# Patient Record
Sex: Male | Born: 1945 | Race: White | Hispanic: No | Marital: Married | Smoking: Never smoker
Health system: Southern US, Community
[De-identification: ages and names within clinical notes are randomized; demographics above are authoritative.]

## PROBLEM LIST (undated history)

## (undated) DIAGNOSIS — G40909 Epilepsy, unspecified, not intractable, without status epilepticus: Secondary | ICD-10-CM

## (undated) DIAGNOSIS — I2510 Atherosclerotic heart disease of native coronary artery without angina pectoris: Secondary | ICD-10-CM

## (undated) DIAGNOSIS — E785 Hyperlipidemia, unspecified: Secondary | ICD-10-CM

## (undated) DIAGNOSIS — E119 Type 2 diabetes mellitus without complications: Secondary | ICD-10-CM

## (undated) DIAGNOSIS — I2109 ST elevation (STEMI) myocardial infarction involving other coronary artery of anterior wall: Secondary | ICD-10-CM

## (undated) DIAGNOSIS — I639 Cerebral infarction, unspecified: Secondary | ICD-10-CM

## (undated) DIAGNOSIS — H409 Unspecified glaucoma: Secondary | ICD-10-CM

## (undated) DIAGNOSIS — Z951 Presence of aortocoronary bypass graft: Secondary | ICD-10-CM

## (undated) HISTORY — DX: Epilepsy, unspecified, not intractable, without status epilepticus: G40.909

## (undated) HISTORY — DX: Unspecified glaucoma: H40.9

## (undated) HISTORY — DX: Atherosclerotic heart disease of native coronary artery without angina pectoris: I25.10

## (undated) HISTORY — DX: Cerebral infarction, unspecified: I63.9

## (undated) HISTORY — DX: Presence of aortocoronary bypass graft: Z95.1

## (undated) HISTORY — DX: Hyperlipidemia, unspecified: E78.5

## (undated) HISTORY — DX: Type 2 diabetes mellitus without complications: E11.9

## (undated) HISTORY — DX: ST elevation (STEMI) myocardial infarction involving other coronary artery of anterior wall: I21.09

## (undated) HISTORY — DX: Neonatal cerebral infarction, unspecified side: P91.829

---

## 1995-01-12 DIAGNOSIS — I2109 ST elevation (STEMI) myocardial infarction involving other coronary artery of anterior wall: Secondary | ICD-10-CM

## 1995-01-12 DIAGNOSIS — Z951 Presence of aortocoronary bypass graft: Secondary | ICD-10-CM

## 1995-01-12 HISTORY — DX: Presence of aortocoronary bypass graft: Z95.1

## 1995-01-12 HISTORY — DX: ST elevation (STEMI) myocardial infarction involving other coronary artery of anterior wall: I21.09

## 1995-01-12 HISTORY — PX: CORONARY ARTERY BYPASS GRAFT: SHX141

## 1995-05-05 DIAGNOSIS — I2109 ST elevation (STEMI) myocardial infarction involving other coronary artery of anterior wall: Secondary | ICD-10-CM | POA: Insufficient documentation

## 1995-11-27 HISTORY — PX: CARDIAC CATHETERIZATION: SHX172

## 1999-10-13 ENCOUNTER — Encounter: Admission: RE | Admit: 1999-10-13 | Discharge: 2000-01-11 | Payer: Self-pay | Admitting: Cardiology

## 2000-01-12 DIAGNOSIS — I2510 Atherosclerotic heart disease of native coronary artery without angina pectoris: Secondary | ICD-10-CM

## 2000-01-12 HISTORY — DX: Atherosclerotic heart disease of native coronary artery without angina pectoris: I25.10

## 2000-03-02 ENCOUNTER — Ambulatory Visit (HOSPITAL_COMMUNITY): Admission: RE | Admit: 2000-03-02 | Discharge: 2000-03-02 | Payer: Self-pay | Admitting: Cardiology

## 2000-03-02 ENCOUNTER — Encounter: Payer: Self-pay | Admitting: Cardiology

## 2000-03-02 HISTORY — PX: CARDIAC CATHETERIZATION: SHX172

## 2000-03-31 DIAGNOSIS — I2510 Atherosclerotic heart disease of native coronary artery without angina pectoris: Secondary | ICD-10-CM | POA: Insufficient documentation

## 2003-09-06 ENCOUNTER — Encounter: Admission: RE | Admit: 2003-09-06 | Discharge: 2003-09-06 | Payer: Self-pay | Admitting: Internal Medicine

## 2008-01-12 HISTORY — PX: NM MYOVIEW LTD: HXRAD82

## 2008-06-28 ENCOUNTER — Encounter: Admission: RE | Admit: 2008-06-28 | Discharge: 2008-06-28 | Payer: Self-pay | Admitting: *Deleted

## 2008-07-03 ENCOUNTER — Ambulatory Visit (HOSPITAL_BASED_OUTPATIENT_CLINIC_OR_DEPARTMENT_OTHER): Admission: RE | Admit: 2008-07-03 | Discharge: 2008-07-03 | Payer: Self-pay | Admitting: Urology

## 2010-04-20 LAB — POCT HEMOGLOBIN-HEMACUE: Hemoglobin: 13.6 g/dL (ref 13.0–17.0)

## 2010-04-20 LAB — GLUCOSE, CAPILLARY
Glucose-Capillary: 144 mg/dL — ABNORMAL HIGH (ref 70–99)
Glucose-Capillary: 159 mg/dL — ABNORMAL HIGH (ref 70–99)

## 2010-05-26 NOTE — Op Note (Signed)
NAMEDAJION, BICKFORD            ACCOUNT NO.:  [id number]   MEDICAL RECORD NO.:  [id number]          PATIENT TYPE:  AMB   LOCATION:  [facility]                         FACILITY:  [facility]   PHYSICIAN:  Excell Seltzer. Annabell Howells, M.D.    DATE OF BIRTH:  February 12, 1945   DATE OF PROCEDURE:  07/03/2008  DATE OF DISCHARGE:                               OPERATIVE REPORT   PROCEDURE:  Ureteroscopy with a stone extraction.   PREOPERATIVE DIAGNOSIS:  Right ureterovesical junction stone.   POSTOPERATIVE DIAGNOSIS:  Right ureterovesical junction stone with stone  migrated to the bladder.   SURGEON:  Dr. Bjorn Pippin   ANESTHESIA:  General.   SPECIMENS:  Stone.   COMPLICATIONS:  None.   INDICATIONS:  Mr. Kleinman is a 65 year old male with a right distal  ureteral stone.  His preop film this morning did not see obvious stone  but there was stool in the rectum that appeared to be obscuring a stone  on the right and he was still symptomatic and had not passed the stone,  so we elected to proceed.   FINDINGS/PROCEDURE:  The patient was taken operating room where general  anesthetic was induced.  He had received IV Cipro.  He was placed in  lithotomy position.  His perineum and genitalia were prepped with  Betadine solution.  He was draped in usual sterile fashion.  An initial  attempt at cystoscopy with a 22-French scope was not successful because  of some meatal narrowing.  I elected to just scope him with the  ureteroscope.  The remainder of the urethra was unremarkable.  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  Examination of bladder revealed a smooth wall without  tumors or inflammation, but there was a stone in the bladder.  The left  ureteral orifice was unremarkable.  The right ureteral orifice had some  evidence of erythema.  The ureteroscope was passed up the right ureteral  orifice to ensure no residual stone fragments were noted.  None were  seen.  The  stone in the bladder was grasped with a nitinol basket and  removed without difficulty.  The bladder was drained with a 16-French  red rubber catheter.  The patient was taken down from lithotomy  position.  His anesthetic was reversed.  He was moved to the recovery  room in stable condition.  There were no complications.      Excell Seltzer. Annabell Howells, M.D.  Electronically Signed     JJW/MEDQ  D:  07/03/2008  T:  07/03/2008  Job:  [id number]

## 2010-05-29 NOTE — Cardiovascular Report (Signed)
[place]  Patient:    Levi Obrien, Levi Obrien                     MRN: [id number] Proc. Date: 03/02/00 Adm. Date:  [id number] Attending:  Silvestre Mesi CC:         Cardiac Catheterization Laboratory   Cardiac Catheterization  PROCEDURES: 1. Left heart catheterization. 2. Coronary cineangiography. 3. Left ventricular cineangiography. 4. Abdominal aortogram. 5. Vein graft cineangiography. 6. Right internal mammary artery graft cineangiography. 7. Left internal mammary artery graft cineangiography. 8. Perclose to the right femoral artery.  INDICATIONS FOR PROCEDURE:  This 65 year old male has a history of three-vessel coronary artery disease and is status post coronary artery bypass graft surgery in November of 1997.  Since then, he has had yearly treadmill examinations which have been normal until last fall when he had a positive test which was new since his surgery.  Recently, he had an increase in exertional angina and continued to have chest pain after increasing his antianginal meds.  He is now scheduled for a repeat catheterization.  He has a history of hypertension.  DESCRIPTION OF PROCEDURE:  After signing an informed consent, the patient was premedicated with 50 mg of Benadryl intravenously and brought to the cardiac catheterization lab.  Hi s right groin was prepped and draped in a sterile fashion and anesthetized locally with 1% lidocaine.  A #6 French introducer sheath was inserted percutaneously into the right femoral artery.  The 6 [place] #4 Judkins coronary catheters were used to make injections into the native coronary arteries.  The right coronary catheter was used to make a mid injections into the sequential vein graft to the intermediate and diagonal branches and also into the vein graft to the distal obtuse marginal branch. The right coronary catheter was also used to make injections into the right subclavian artery  visualizing the right internal mammary artery graft to the right coronary artery.  Right coronary catheter was used to make selective injections into the left internal mammary artery graft to the LAD.  A 6 French pigtail catheter was used to measure pressures in the left ventricle and aorta and to make mid stream into the left ventricle and abdominal aorta. The patient tolerated the procedure well and no complications were noted.  At the end of the procedure, the catheter and sheath were removed from the right femoral artery and hemostasis easily obtained with a Perclose closure system.  MEDICATIONS GIVEN:  None.  HEMODYNAMIC DATA:  Left ventricular pressure 129/0-13, aortic pressure 128/74 with a mean of 96.  Left ventricular ejection fraction 50%.  CINE FINDINGS:  CORONARY CINEANGIOGRAPHY:  Left coronary artery:  The ostium and left main appear normal.  Left anterior descending:  The LAD is totally occluded at its origin.  The mid and distal segment fill by way of vein graft and internal mammary graft.  Intermediate branch:  The intermediate branch is severely diseased throughout the proximal segment with a segmental 90% stenosis.  The mid and distal segment fill by way of sequential vein graft.  Circumflex coronary artery:  The circumflex coronary artery has a critical greater than 95% focal stenosis in its middle segment just proximal to the takeoff of the second large obtuse marginal branch.  There is bidirectional flow just beyond this bifurcation indicating good flow through the vein graft.  Right coronary artery:  The ostium appears normal.  The right coronary artery has a segmental severe stenosis in its  proximal and middle segment with a segmental 90-95% stenosis prior to and involving the right ventricular branch. Just distal to the right ventricular branch is the anastomotic site for the right internal mammary artery graft.  The mid section from the right ventricular  branch to the acute angle and beyond fills both antegrade and fills by way of the right internal mammary graft.  VEIN GRAFT CINEANGIOGRAM:  Sequential vein graft to the intermediate branch and diagonal branch:  This is a sequential graft that appears normal with very normal flow and normal anastomotic sites.  There is very good flow in the intermediate branch and diagonal branch.  Vein graft to the second obtuse marginal branch:  This vein graft also appears very normal with very normal flow into the distal obtuse marginal system.  Right internal mammary graft appears normal with very good flow into the mid right coronary artery with a normal distal anastomotic site.  Left internal mammary graft to the LAD.  The LIMA graft appears normal with very normal flow.  LEFT VENTRICULAR CINEANGIOGRAM:  The left ventricular chamber size appears normal with normal contractility of the inferior and apical segments.  There is mild anterior hypokinesis.  The mitral and aortic valves appear normal.  ABDOMINAL AORTOGRAM:  The abdominal aorta is mildly tortuous and has mild calcification, otherwise there is normal flow and no significant stenosis. The renal arteries appear normal.  FINAL DIAGNOSES: 1. Severe three-vessel coronary artery disease, now with totally occluded    proximal left anterior descending. 2. Normal sequential vein graft to the diagonal and intermediate branches. 3. Normal vein graft to the obtuse marginal branch. 4. Normal right internal artery mammary graft to the right coronary artery. 5. Normal left internal mammary artery graft to the left anterior descending. 6. Normal left ventricular function, ejection fraction 50% with mild anterior    hypokinesia. 7. Normal abdominal aorta and renal arteries. 8. Successful Perclose of the right coronary artery.  DISPOSITION:  Will monitor on the 6500 unit and discharge later today. DD:  03/02/00 TD:  03/03/00 Job:  [id number]

## 2010-08-12 HISTORY — PX: TRANSTHORACIC ECHOCARDIOGRAM: SHX275

## 2012-06-14 ENCOUNTER — Emergency Department (HOSPITAL_COMMUNITY)
Admission: EM | Admit: 2012-06-14 | Discharge: 2012-06-14 | Disposition: A | Discharge status: Home / Self Care | Payer: BC Managed Care – PPO | Attending: Emergency Medicine | Admitting: Emergency Medicine

## 2012-06-14 ENCOUNTER — Emergency Department (HOSPITAL_COMMUNITY): Payer: BC Managed Care – PPO

## 2012-06-14 ENCOUNTER — Encounter (HOSPITAL_COMMUNITY): Payer: Self-pay | Admitting: *Deleted

## 2012-06-14 DIAGNOSIS — N200 Calculus of kidney: Secondary | ICD-10-CM

## 2012-06-14 DIAGNOSIS — I252 Old myocardial infarction: Secondary | ICD-10-CM | POA: Insufficient documentation

## 2012-06-14 DIAGNOSIS — E119 Type 2 diabetes mellitus without complications: Secondary | ICD-10-CM | POA: Insufficient documentation

## 2012-06-14 LAB — URINE MICROSCOPIC-ADD ON

## 2012-06-14 LAB — COMPREHENSIVE METABOLIC PANEL
AST: 23 U/L (ref 0–37)
Albumin: 3.9 g/dL (ref 3.5–5.2)
Calcium: 9.8 mg/dL (ref 8.4–10.5)
Creatinine, Ser: 1.07 mg/dL (ref 0.50–1.35)
Total Protein: 6.7 g/dL (ref 6.0–8.3)

## 2012-06-14 LAB — POCT I-STAT, CHEM 8
Calcium, Ion: 1.16 mmol/L (ref 1.13–1.30)
Glucose, Bld: 239 mg/dL — ABNORMAL HIGH (ref 70–99)
HCT: 37 % — ABNORMAL LOW (ref 39.0–52.0)
TCO2: 24 mmol/L (ref 0–100)

## 2012-06-14 LAB — CBC WITH DIFFERENTIAL/PLATELET
Basophils Absolute: 0.1 10*3/uL (ref 0.0–0.1)
Basophils Relative: 1 % (ref 0–1)
Eosinophils Absolute: 0 10*3/uL (ref 0.0–0.7)
Eosinophils Relative: 0 % (ref 0–5)
HCT: 40.2 % (ref 39.0–52.0)
MCHC: 34.6 g/dL (ref 30.0–36.0)
Monocytes Absolute: 0.9 10*3/uL (ref 0.1–1.0)
Neutro Abs: 11.5 10*3/uL — ABNORMAL HIGH (ref 1.7–7.7)
RDW: 13.1 % (ref 11.5–15.5)

## 2012-06-14 LAB — URINALYSIS, ROUTINE W REFLEX MICROSCOPIC
Glucose, UA: 500 mg/dL — AB
Ketones, ur: 15 mg/dL — AB
Leukocytes, UA: NEGATIVE
Protein, ur: 30 mg/dL — AB

## 2012-06-14 MED ORDER — HYDROMORPHONE HCL PF 1 MG/ML IJ SOLN
0.5000 mg | Freq: Once | INTRAMUSCULAR | Status: AC
  Administered 2012-06-14: 0.5 mg via INTRAVENOUS
  Filled 2012-06-14: qty 1

## 2012-06-14 MED ORDER — KETOROLAC TROMETHAMINE 30 MG/ML IJ SOLN
15.0000 mg | Freq: Once | INTRAMUSCULAR | Status: AC
  Administered 2012-06-14: 15 mg via INTRAVENOUS
  Filled 2012-06-14: qty 1

## 2012-06-14 MED ORDER — TAMSULOSIN HCL 0.4 MG PO CAPS: 0.4000 mg | ORAL_CAPSULE | Freq: Every day | ORAL | Status: DC

## 2012-06-14 MED ORDER — KETOROLAC TROMETHAMINE 15 MG/ML IJ SOLN: 15.0000 mg | Freq: Once | INTRAMUSCULAR | Status: DC

## 2012-06-14 MED ORDER — HYDROCODONE-ACETAMINOPHEN 5-325 MG PO TABS
1.0000 | ORAL_TABLET | Freq: Once | ORAL | Status: AC
  Administered 2012-06-14: 1 via ORAL
  Filled 2012-06-14: qty 1

## 2012-06-14 MED ORDER — HYDROCODONE-ACETAMINOPHEN 5-325 MG PO TABS: 1.0000 | ORAL_TABLET | ORAL | Status: DC | PRN

## 2012-06-14 MED ORDER — SODIUM CHLORIDE 0.9 % IV BOLUS (SEPSIS)
1000.0000 mL | Freq: Once | INTRAVENOUS | Status: AC
  Administered 2012-06-14: 1000 mL via INTRAVENOUS

## 2012-06-14 NOTE — ED Notes (Signed)
Patient transported to CT 

## 2012-06-14 NOTE — ED Provider Notes (Signed)
History     CSN: [id number]  Arrival date & time 06/14/12  2027   First MD Initiated Contact with Patient 06/14/12 2043      Chief Complaint  Patient presents with  . Abdominal Pain    (Consider location/radiation/quality/duration/timing/severity/associated sxs/prior treatment) HPI Comments: 67 y.o. male who presents w/ sudden onset left sided flank and abdominal pain. Stated that he was eating food and then had sudden onset of pain. States the pain is exactly like prior hx of kidney stone he had, several years ago, on the right side. No fevers or chills with this.   Patient is a 68 y.o. male presenting with general illness. The history is provided by the patient.  Illness Severity:  Mild Onset quality:  Sudden Timing:  Intermittent Progression:  Waxing and waning Chronicity:  New Associated symptoms: no abdominal pain, no chest pain, no diarrhea, no headaches, no nausea, no rash, no shortness of breath and no wheezing     Past Medical History  Diagnosis Date  . Diabetes mellitus without complication   . MI (myocardial infarction)     Past Surgical History  Procedure Laterality Date  . Bypass graft      5    History reviewed. No pertinent family history.  History  Substance Use Topics  . Smoking status: Never Smoker   . Smokeless tobacco: Not on file  . Alcohol Use: No      Review of Systems  Constitutional: Negative for chills and activity change.  HENT: Negative for drooling, mouth sores and neck pain.   Eyes: Negative for pain and discharge.  Respiratory: Negative for chest tightness, shortness of breath and wheezing.   Cardiovascular: Negative for chest pain.  Gastrointestinal: Negative for nausea, abdominal pain, diarrhea and constipation.  Genitourinary: Positive for flank pain. Negative for dysuria and difficulty urinating.       Left lower abdominal pain  Musculoskeletal: Negative for back pain and arthralgias.  Skin: Negative for color change,  pallor and rash.  Neurological: Negative for dizziness, weakness, light-headedness and headaches.  Psychiatric/Behavioral: Negative for behavioral problems, confusion and agitation.    Allergies  Review of patient's allergies indicates no known allergies.  Home Medications  No current outpatient prescriptions on file.  BP 159/71  Pulse 80  Temp(Src) 98 F (36.7 C) (Oral)  Resp 16  SpO2 98%  Physical Exam  Constitutional: He is oriented to person, place, and time. He appears well-developed. No distress.  HENT:  Head: Normocephalic.  Eyes: Pupils are equal, round, and reactive to light. Right eye exhibits no discharge. Left eye exhibits no discharge.  Neck: Neck supple. No tracheal deviation present.  Cardiovascular: Regular rhythm and intact distal pulses.   Pulmonary/Chest: Effort normal. No stridor. No respiratory distress. He has no wheezes.  No cva ttp  Abdominal: Soft. He exhibits no distension. There is no tenderness. There is no rebound.  Musculoskeletal: Normal range of motion. He exhibits no tenderness.  Neurological: He is alert and oriented to person, place, and time. No cranial nerve deficit.  Skin: Skin is warm. No rash noted. He is not diaphoretic. No erythema.    ED Course  Procedures (including critical care time)  Labs Reviewed  URINALYSIS, ROUTINE W REFLEX MICROSCOPIC - Abnormal; Notable for the following:    Color, Urine AMBER (*)    APPearance CLOUDY (*)    Specific Gravity, Urine 1.034 (*)    Glucose, UA 500 (*)    Hgb urine dipstick LARGE (*)  Bilirubin Urine SMALL (*)    Ketones, ur 15 (*)    Protein, ur 30 (*)    All other components within normal limits  CBC WITH DIFFERENTIAL - Abnormal; Notable for the following:    WBC 13.8 (*)    Neutrophils Relative % 84 (*)    Neutro Abs 11.5 (*)    Lymphocytes Relative 9 (*)    All other components within normal limits  COMPREHENSIVE METABOLIC PANEL - Abnormal; Notable for the following:     Glucose, Bld 280 (*)    GFR calc non Af Amer 71 (*)    GFR calc Af Amer 82 (*)    All other components within normal limits  URINE MICROSCOPIC-ADD ON - Abnormal; Notable for the following:    Bacteria, UA FEW (*)    Crystals CA OXALATE CRYSTALS (*)    All other components within normal limits  POCT I-STAT, CHEM 8 - Abnormal; Notable for the following:    Glucose, Bld 239 (*)    Hemoglobin 12.6 (*)    HCT 37.0 (*)    All other components within normal limits   Ct Abdomen Pelvis Wo Contrast  06/14/2012   *RADIOLOGY REPORT*  Clinical Data: Severe left lower quadrant abdominal pain and left flank pain.  CT ABDOMEN AND PELVIS WITHOUT CONTRAST  Technique:  Multidetector CT imaging of the abdomen and pelvis was performed following the standard protocol without intravenous contrast.  Comparison: Abdominal radiograph from 07/03/2008, and abdominal ultrasound performed 06/28/2008  Findings: Mild bibasilar atelectasis or scarring is noted.  The patient is status post median sternotomy.  A tiny hiatal hernia is noted.  A small calcified granuloma is noted at the periphery of the hepatic dome; the liver is otherwise unremarkable.  A small 2.8 cm hypodensity within the spleen may reflect a small cyst, but is nonspecific in appearance.  The gallbladder is within normal limits.  The pancreas and adrenal glands are unremarkable.  There is mild left-sided hydronephrosis, with stranding along the course of the left ureter, and a small obstructing 3 mm stone just proximal to the left vesicoureteral junction.  Minimal nonspecific perinephric stranding is noted bilaterally.  The right kidney is otherwise unremarkable in appearance.  No nonobstructing renal stones are identified.  No free fluid is identified.  The small bowel is unremarkable in appearance.  The stomach is otherwise unremarkable.  No acute vascular abnormalities are seen.  Scattered calcification is noted along the abdominal aorta and its branches.  The  appendix is not definitely seen; there is no evidence for appendicitis.  A single diverticulum is noted along the descending colon, and minimal diverticulosis is noted at the proximal sigmoid colon, without evidence of diverticulitis.  The bladder is decompressed and not well assessed.  The prostate is enlarged, measuring 5.5 cm in transverse dimension.  No inguinal lymphadenopathy is seen.  No acute osseous abnormalities are identified.  IMPRESSION:  1.  Mild left-sided hydronephrosis, with a small obstructing 3 mm ureteral stone noted distally, just proximal to the left vesicoureteral junction. 2.  Scattered calcification along the abdominal aorta and its branches. 3.  Minimal diverticulosis of the proximal sigmoid colon, without evidence of diverticulitis. 4.  Enlarged prostate noted. 5.  Tiny hiatal hernia seen. 6.  Mild bibasilar atelectasis or scarring noted.   Original Report Authenticated By: Tonia Ghent, M.D.     MDM  Pt found to have left obstructing kidney stone, only 3mm, mild hydro -- U/A does not show UTI. Will treat  with medical expulsion therapy as no acute intervention is currently required. Pt's pain is under control, no vomiting in the Er. Given flomax and pain medication. Told to return to the ER if he develops fever or pain not controlled at home. Told to see pcp within 2 days for re-evaluation, which family states they can do.   1. Kidney stone             Bernadene Person, MD 06/14/12 2324

## 2012-06-14 NOTE — ED Notes (Addendum)
Pt states severe abdominal pain that started 2 hours ago after eating. Pt states that his pain is lower abdomen, pt states that it feel like his kidney stone several years ago but denies back pain. Pt unable to vomit or have a bowel movement. Pt unable to sit still in chair for triage.

## 2012-06-15 NOTE — ED Provider Notes (Signed)
I saw and evaluated the patient, reviewed the resident's note and I agree with the findings and plan. The patient presents with complaints of left flank pain that started suddenly this evening.  There is nausea, but no fever or chills.  No other urinary complaints.  He has had a kidney stone in the past and this feels similar.  On exam, the vitals are stable and the patient is afebrile.  The heart is regular rate and rhythm and the lungs are clear.  The abdomen is non-tender and, and there is no left sided cva ttp.    The workup reveals a 3mm distal ureteral stone with hydronephrosis, without evidence for infection on the ua.  He is feeling better with meds given and is appropriate for discharge to home.  Instructions for return given.  Geoffery Lyons, MD 06/15/12 [phone]

## 2012-12-27 ENCOUNTER — Telehealth (HOSPITAL_COMMUNITY): Payer: Self-pay | Admitting: *Deleted

## 2013-01-10 ENCOUNTER — Telehealth (HOSPITAL_COMMUNITY): Payer: Self-pay | Admitting: *Deleted

## 2013-03-02 ENCOUNTER — Other Ambulatory Visit (HOSPITAL_COMMUNITY): Payer: Self-pay | Admitting: Cardiology

## 2013-03-02 DIAGNOSIS — Z951 Presence of aortocoronary bypass graft: Secondary | ICD-10-CM

## 2013-03-16 ENCOUNTER — Ambulatory Visit (HOSPITAL_COMMUNITY)
Admission: RE | Admit: 2013-03-16 | Discharge: 2013-03-16 | Disposition: A | Discharge status: Home / Self Care | Payer: BC Managed Care – PPO | Source: Ambulatory Visit | Attending: Cardiovascular Disease | Admitting: Cardiovascular Disease

## 2013-03-16 DIAGNOSIS — Z951 Presence of aortocoronary bypass graft: Secondary | ICD-10-CM

## 2013-03-26 ENCOUNTER — Ambulatory Visit: Payer: BC Managed Care – PPO | Admitting: Cardiology

## 2013-03-29 ENCOUNTER — Other Ambulatory Visit (HOSPITAL_COMMUNITY): Payer: Self-pay | Admitting: Cardiology

## 2013-03-29 ENCOUNTER — Ambulatory Visit (HOSPITAL_COMMUNITY)
Admission: RE | Admit: 2013-03-29 | Discharge: 2013-03-29 | Disposition: A | Discharge status: Home / Self Care | Payer: BC Managed Care – PPO | Source: Ambulatory Visit | Attending: Cardiology | Admitting: Cardiology

## 2013-03-29 DIAGNOSIS — Z951 Presence of aortocoronary bypass graft: Secondary | ICD-10-CM

## 2013-03-29 HISTORY — PX: NM MYOVIEW LTD: HXRAD82

## 2013-03-29 MED ORDER — TECHNETIUM TC 99M SESTAMIBI GENERIC - CARDIOLITE
29.8000 | Freq: Once | INTRAVENOUS | Status: AC | PRN
  Administered 2013-03-29: 29.8 via INTRAVENOUS

## 2013-03-29 MED ORDER — TECHNETIUM TC 99M SESTAMIBI GENERIC - CARDIOLITE
10.7000 | Freq: Once | INTRAVENOUS | Status: AC | PRN
  Administered 2013-03-29: 11 via INTRAVENOUS

## 2013-03-29 NOTE — Procedures (Addendum)
[facility] [address] [place] [address] [phone]  Cardiology Nuclear Med Study  Levi Obrien is a 68 y.o. male     MRN : [id number]     DOB: 12-15-1945  Procedure Date: 03/29/2013  Nuclear Med Background Indication for Stress Test:  Graft Patency History:  CAD;MI;CABG X5;SEIZURES Cardiac Risk Factors: Family History - CAD and NIDDM  Symptoms:  Pt denies symptoms at this time.   Nuclear Pre-Procedure Caffeine/Decaff Intake:  1:00am NPO After: 11am   IV Site: R Forearm  IV 0.9% NS with Angio Cath:  22g  Chest Size (in):  40"  IV Started by: Emmit PomfretAmanda Hicks, RN  Height: 5\' 4"  (1.626 m)  Cup Size: n/a  BMI:  Body mass index is 25.49 kg/(m^2). Weight:  148 lb 9.6 oz (67.405 kg)   Tech Comments:  n/a    Nuclear Med Study 1 or 2 day study: 1 day  Stress Test Type:  Stress  Order Authorizing Provider:  Bryan Lemmaavid Harding, MD   Resting Radionuclide: Technetium 5882m Sestamibi  Resting Radionuclide Dose: 10.7 mCi   Stress Radionuclide:  Technetium 9882m Sestamibi  Stress Radionuclide Dose: 29.8 mCi           Stress Protocol Rest HR: 67 Stress HR: 139  Rest BP: 123/79 Stress BP: 183/66  Exercise Time (min): 5 METS: 7.0   Predicted Max HR: 155 bpm % Max HR: 89.68 bpm Rate Pressure Product: [id number]  Dose of Adenosine (mg):  n/a Dose of Lexiscan: n/a mg  Dose of Atropine (mg): n/a Dose of Dobutamine: n/a mcg/kg/min (at max HR)  Stress Test Technologist: Esperanza Sheetserry-Marie Martin, CCT Nuclear Technologist: Gonzella LexPam Phillips, CNMT   Rest Procedure:  Myocardial perfusion imaging was performed at rest 45 minutes following the intravenous administration of Technetium 5782m Sestamibi. Stress Procedure:  The patient performed treadmill exercise using a Bruce  Protocol for 5 minutes. The patient stopped due to leg fatigue and denied any chest pain.  There were no significant ST-T wave changes.  Technetium 2682m Sestamibi was injected at peak  exercise and myocardial perfusion imaging was performed after a brief delay.  Transient Ischemic Dilatation (Normal <1.22):  0.83 Lung/Heart Ratio (Normal <0.45):  0.39 QGS EDV:  55 ml QGS ESV:  23 ml LV Ejection Fraction: 58%     Rest ECG: NSR with non-specific ST-T wave changes  Stress ECG: Horizontal 1 mm ST depression is seen in multiple leads  QPS Raw Data Images:  Normal; no motion artifact; normal heart/lung ratio. Stress Images:  There is decreased uptake in the anterior wall. Rest Images:  Comparison with the stress images reveals no significant change. Subtraction (SDS):  There is a fixed defect that is most consistent with a previous infarction. LV Wall Motion:  Normal overall LV function, EF 58%. There is mid-anterior wall hypokinesis.  Impression Exercise Capacity:  Good exercise capacity. BP Response:  Normal blood pressure response. Clinical Symptoms:  No significant symptoms noted. ECG Impression:  Significant ST abnormalities consistent with ischemia. Comparison with Prior Nuclear Study: No previous nuclear study performed   Overall Impression:  Low risk stress nuclear study with limited mid anterior wall scar and sparing of the septum and apex. Consider stenotic/occluded LAD with patent distal bypass versus diagonal artery disease.[name].   [name], MD  03/29/2013 4:57 PM

## 2013-03-31 ENCOUNTER — Encounter: Payer: Self-pay | Admitting: Cardiology

## 2013-03-31 DIAGNOSIS — E785 Hyperlipidemia, unspecified: Secondary | ICD-10-CM | POA: Insufficient documentation

## 2013-03-31 DIAGNOSIS — Z951 Presence of aortocoronary bypass graft: Secondary | ICD-10-CM | POA: Insufficient documentation

## 2013-04-02 ENCOUNTER — Telehealth: Payer: Self-pay | Admitting: *Deleted

## 2013-04-02 NOTE — Telephone Encounter (Signed)
Message copied by Tobin ChadMARTIN, Mackenize Delgadillo V. on Mon Apr 02, 2013  8:52 AM ------      Message from: Marykay LexHARDING, DAVID W      Created: Sat Mar 31, 2013 12:55 AM       Interesting stress test that shows no evidence of ischemia. It shows the scar from his heart attack but that it only affected the upstream portion of where the arteries blocked because the grafted segment looks healthy.            This is consistent with his known anatomy.            Low risk: Low normal exercise capacity            HARDING,DAVID W, MD             ------

## 2013-04-02 NOTE — Telephone Encounter (Signed)
Spoke to patient.  Myoview Result given . Verbalized understanding  

## 2013-04-10 ENCOUNTER — Other Ambulatory Visit: Payer: Self-pay | Admitting: Cardiology

## 2013-04-10 NOTE — Telephone Encounter (Signed)
Rx refill sent to pharmacy. 

## 2013-05-02 ENCOUNTER — Ambulatory Visit (INDEPENDENT_AMBULATORY_CARE_PROVIDER_SITE_OTHER): Payer: BC Managed Care – PPO | Admitting: Cardiology

## 2013-05-02 ENCOUNTER — Encounter: Payer: Self-pay | Admitting: Cardiology

## 2013-05-02 VITALS — BP 126/80 | HR 71 | Ht 64.0 in | Wt 148.0 lb

## 2013-05-02 DIAGNOSIS — I2510 Atherosclerotic heart disease of native coronary artery without angina pectoris: Secondary | ICD-10-CM

## 2013-05-02 DIAGNOSIS — Z951 Presence of aortocoronary bypass graft: Secondary | ICD-10-CM

## 2013-05-02 DIAGNOSIS — I2109 ST elevation (STEMI) myocardial infarction involving other coronary artery of anterior wall: Secondary | ICD-10-CM

## 2013-05-02 DIAGNOSIS — E785 Hyperlipidemia, unspecified: Secondary | ICD-10-CM

## 2013-05-02 MED ORDER — LISINOPRIL 2.5 MG PO TABS: ORAL_TABLET | ORAL | Status: DC

## 2013-05-02 MED ORDER — ATORVASTATIN CALCIUM 40 MG PO TABS: 40.0000 mg | ORAL_TABLET | Freq: Every morning | ORAL | Status: DC

## 2013-05-02 NOTE — Patient Instructions (Signed)
Ejection  Fraction is better.  Continue with current medication.  Continue exercising.  Your physician wants you to follow-up in 12 months Dr Herbie BaltimoreHarding. You will receive a reminder letter in the mail two months in advance. If you don't receive a letter, please call our office to schedule the follow-up appointment.  1

## 2013-05-03 NOTE — Progress Notes (Signed)
PATIENT: Levi Obrien MRN: [id number]  DOB: Dec 07, 1945   DOV:05/04/2013 PCP: Georgianne FickAMACHANDRAN,AJITH, MD  Clinic Note: Chief Complaint  Patient presents with  . Annual Exam    no chest pain,no sob, no edema,  lost 17 lbs -ent low carb diet     HPI: Levi Highduardo G Lundy is a 68 y.o.  male with a PMH below who presents today for annual followup. He was last seen at the end of February 2014 and was doing well at that time to 2 visits ago I started him on low-dose ACE inhibitor, and his most recent Myoview this past month did show an improved overall function. This test did show mid LAD distribution infarct, but consistent with known anatomy.  Interval History: Today he presents with no significant symptoms. He continues to be on a very aggressive diet. Again about 8 pounds before starting the diet and now has lost 17 pounds overall. His been watching his diet and trying to increase his level of exercise. He is walking about 2 miles a day and also doing yard work. He thinks is a little weight loss may also be related to starting Exenatide. He tells me with this his lipid control and glycemic control his also significantly improved.  He denies chest pain or shortness of breath with rest or exertion.  No PND, orthopnea or edema.  No palpitations, lightheadedness, dizziness, weakness or syncope/near syncope. No TIA/amaurosis fugax symptoms. No melena, hematochezia hematuria. No claudication  Past Medical History  Diagnosis Date  . Acute MI, anterior wall, subsequent episode of care  1997    Failed PCI-LAD --> referred for CABG  . S/P CABG x 5 1997    LIMA-LAD, RIMA-RCA, SVG-RI-D1, SVG-OM  . CAD in native artery  2002    Cardiac cath: Ostial LAD 100%; R. I 99% proximal, Prox Cx  99%; 90-95% mid RCA.  Patent grafts  . Hyperlipidemia LDL goal < 70      on statin  . Diabetes mellitus type 2, controlled   . Glaucoma   . Neonatal stroke     With right arm palsy and subsequent seizure disorder    . Seizure disorder, secondary     Prior Cardiac Evaluation and Past Surgical History: Past Surgical History  Procedure Laterality Date  . Coronary artery bypass graft   1997    LIMA-LAD, RIMA-RCA, SVG-RI a-D1, SVG-OM  . Transthoracic echocardiogram   August 2012    Mildly dilated LV; EF 45-50%. Mild apical HK and mild septal HK with impaired relaxation (grade 1 diastolic function) mildly reduced RV function.  Marland Kitchen. Nm myoview ltd  2010    LOW RISK. No ischemia or infarct.  . Nm myoview ltd  03/29/2013    LOW RISK: No ischemia. Limited mid anterior wall scar sparing septum and apex; suggests occluded LAD with patent distal bypass versus diagonal disease --> consistent with known CAD  . Cardiac catheterization  03/02/2000    LAD 100% occlusion: RI and circumflex--proximal 90%; mid RCA 90 a 95%. Patent LIMA-LAD, RIMA-RCA, SVG to OM, SVG-RI-D1  . Cardiac catheterization  11/27/1995    Totally occluded LAD stented with a 3x15 Multi-Link stent, deployed at 8atm for 52sec. 95% focal eccentric stenosis of the RCA. Considered CABG versus angioplasty of the RCA lesion.    No Known Allergies  Current Outpatient Prescriptions  Medication Sig Dispense Refill  . ACCU-CHEK AVIVA PLUS test strip       . aspirin 81 MG tablet Take 81 mg by mouth  at bedtime.      Marland Kitchen atorvastatin (LIPITOR) 40 MG tablet Take 1 tablet (40 mg total) by mouth every morning.  30 tablet  11  . Exenatide (BYDUREON [place]) Inject 1 application into the skin once a week. On thursdays      . glimepiride (AMARYL) 4 MG tablet Take 4 mg by mouth 2 (two) times daily.      Marland Kitchen lisinopril (PRINIVIL,ZESTRIL) 2.5 MG tablet TAKE 1 TABLET ONCE DAILY.  30 tablet  11  . metformin (FORTAMET) 1000 MG (OSM) 24 hr tablet Take 1,000 mg by mouth 2 (two) times daily.      . timolol (TIMOPTIC-XR) 0.5 % ophthalmic gel-forming Apply 1 drop to eye daily.      Marland Kitchen triamcinolone ointment (KENALOG) 0.1 % Apply 1 application topically daily.       No current  facility-administered medications for this visit.   History   Social History Narrative   He is a married father of 2. He walks daily and gets plan exercise. Never smoked and does not drink alcohol.   ROS: A comprehensive Review of Systems - Negative except Recurrent rash on his arms.  PHYSICAL EXAM BP 126/80  Pulse 71  Ht 5\' 4"  (1.626 m)  Wt 148 lb (67.132 kg)  BMI 25.39 kg/m2 General appearance: alert, cooperative, appears stated age, no distress and Pleasant mood and affect. Well-nourished well-groomed. Neck: no adenopathy, no carotid bruit, no JVD, supple, symmetrical, trachea midline and thyroid not enlarged, symmetric, no tenderness/mass/nodules Lungs: clear to auscultation bilaterally, normal percussion bilaterally and Nonlabored, good air movement Heart: regular rate and rhythm, S1, S2 normal, no murmur, click, rub or gallop and normal apical impulse Abdomen: soft, non-tender; bowel sounds normal; no masses,  no organomegaly Extremities: No clubbing cyanosis or edema. He does have the significant atrophy of the right upper extremity and shoulder from his neonatal stroke. Pulses: 2+ and symmetric Skin: Diffuse macular erythematous rash on bilateral upper extremities. Neurologic: Cranial nerves: normal a   Adult ECG Report  Rate: 71 ;  Rhythm: normal sinus rhythm  QRS Axis: -42 ;  PR Interval: 138 ;  QRS Duration: 94 ; QTc: 421;  Voltages: Normal  Conduction Disturbances: none  Other Abnormalities: Septal infarct, age undetermined. Left axis deviation  Narrative Interpretation: Relatively stable EKG  Recent Labs: None available  ASSESSMENT / PLAN: CAD in native artery; occluded LAD, subtotal occlusion of ostial RI, proximal Cx, mid RCA No recurrent symptoms to suggest angina. Negative ischemia on Myoview last month. Since we have seen an improvement in ejection fraction, we'll continue the ACE inhibitor. In the past denies use beta blocker due to borderline bradycardia and  lack of symptoms. He continues to be very active with his walking he and has no symptoms. Would not need a repeat stress test for 5 years if he remains as active.  Acute MI, anterior wall, subsequent episode of care No recurrent symptoms since that infarct. Mild infarct seen on stress test but no ischemia. Normal ejection fraction on the recent Myoview.  Hyperlipidemia LDL goal <70 Continues to be on a statin. His labs are being followed by his PCP. Last lab values on January 2014 showed excellent control.   Orders Placed This Encounter  Procedures  . EKG 12-Lead   Meds ordered this encounter  Medications  . ACCU-CHEK AVIVA PLUS test strip    Sig:   . atorvastatin (LIPITOR) 40 MG tablet    Sig: Take 1 tablet (40 mg total) by mouth  every morning.    Dispense:  30 tablet    Refill:  11  . lisinopril (PRINIVIL,ZESTRIL) 2.5 MG tablet    Sig: TAKE 1 TABLET ONCE DAILY.    Dispense:  30 tablet    Refill:  11    Followup: One year  Duane Trias W. Herbie BaltimoreHARDING, M.D., M.S. Interventional Cardiology [facility]

## 2013-05-04 ENCOUNTER — Encounter: Payer: Self-pay | Admitting: Cardiology

## 2013-05-04 NOTE — Assessment & Plan Note (Signed)
No recurrent symptoms to suggest angina. Negative ischemia on Myoview last month. Since we have seen an improvement in ejection fraction, we'll continue the ACE inhibitor. In the past denies use beta blocker due to borderline bradycardia and lack of symptoms. He continues to be very active with his walking he and has no symptoms. Would not need a repeat stress test for 5 years if he remains as active.

## 2013-05-04 NOTE — Assessment & Plan Note (Signed)
Continues to be on a statin. His labs are being followed by his PCP. Last lab values on January 2014 showed excellent control.

## 2013-05-04 NOTE — Assessment & Plan Note (Signed)
No recurrent symptoms since that infarct. Mild infarct seen on stress test but no ischemia. Normal ejection fraction on the recent Myoview.

## 2014-02-14 ENCOUNTER — Ambulatory Visit (INDEPENDENT_AMBULATORY_CARE_PROVIDER_SITE_OTHER): Payer: BC Managed Care – PPO

## 2014-02-14 ENCOUNTER — Ambulatory Visit (INDEPENDENT_AMBULATORY_CARE_PROVIDER_SITE_OTHER): Payer: BC Managed Care – PPO | Admitting: Family Medicine

## 2014-02-14 VITALS — BP 102/48 | HR 68 | Temp 98.5°F | Resp 16

## 2014-02-14 DIAGNOSIS — M25561 Pain in right knee: Secondary | ICD-10-CM

## 2014-02-14 MED ORDER — HYDROCODONE-ACETAMINOPHEN 5-325 MG PO TABS: 1.0000 | ORAL_TABLET | Freq: Four times a day (QID) | ORAL | Status: DC | PRN

## 2014-02-14 NOTE — Progress Notes (Signed)
  Levi Obrien - 69 y.o. male MRN [id number]  Date of birth: 1945/01/21  SUBJECTIVE:  Including CC & ROS.  Patient is a 69 year old male past medical history significant for acute MI, CAD status post 5 vessel CABG, hyperlipidemia, diabetes, hypertension. Patient presents today after an acute fall at home it directly on his right and left knee. However his right knee has caused him much more severe pain and swelling. He has bruising as well but no saline erythema. Patient having difficulty walking since this injury and inability to place any pressure on his leg. Has treated with ice and Tylenol with no relief. Worse with standing and walking; and difficult to hold pressure on his knee.  ROS:  Constitutional:  No fever, chills, or fatigue.  Respiratory:  No shortness of breath, cough, or wheezing Cardiovascular:  No palpitations, chest pain or syncope Gastrointestinal:  No nausea, no abdominal pain Review of systems otherwise negative except for what is stated in HPI  HISTORY: Past Medical, Surgical, Social, and Family History Reviewed & Updated per EMR. Pertinent Historical Findings include: CAD, HTN, HLD, CABG  PHYSICAL EXAM:  VS: BP:(!) 102/48 mmHg  HR:68bpm  TEMP:98.5 F (36.9 C)(Oral)  RESP:98 %  HT:    WT:   BMI:  RIGHT KNEE EXAM:  General: well nourished, no acute distress Skin of LE: warm; dry, no rashes, lesions, ecchymosis or erythema. Vascular: Dorsal pedal pulses 2+ bilaterally Neurologically: Sensation to light touch lower extremities equal and intact  Normal to inspection with no erythema 2+ effusion no obvious bony abnormalities. Inspection reveals small patch of bruising erythema, 2+ joint effusion. Tenderness along the lateral joint line and over the patella. Patient has mild loss of range of motion with extension to +2 flexion to about 80 and painful. Ligaments with solid consistent endpoints including ACL, PCL, LCL, MCL. Meniscal evaluation: positive McMurray's  test, antalgic gait. Hamstring 5/5 bilateral quadriceps strength 3/5 on the right  4 view x-rays of the knee including standing were personally reviewed by myself Dr. [name] Normal AP and lateral views however concern of patella fracture on sunrise and lateral views  ASSESSMENT & PLAN:  Patient presented today after a traumatic fall onto his right and left knee. Right knee is concerning him because of increased pain swelling severe tenderness. Patient is unable to bear weight or walk. X-rays today were concerning for possible patella fracture. Patient was placed in a knee immobilizer, provided pain medication, recommended nonweightbearing with crutches, and will follow-up for orthopedics for repeat x-ray and possible knee effusion aspiration. An [facility] was [facility] for tomorrow morning 02/15/14 at 8:30 AM.

## 2014-05-22 ENCOUNTER — Other Ambulatory Visit: Payer: Self-pay | Admitting: Cardiology

## 2014-05-23 ENCOUNTER — Other Ambulatory Visit: Payer: Self-pay | Admitting: *Deleted

## 2014-05-23 MED ORDER — LISINOPRIL 2.5 MG PO TABS: ORAL_TABLET | ORAL | Status: DC

## 2014-05-30 ENCOUNTER — Other Ambulatory Visit: Payer: Self-pay | Admitting: Cardiology

## 2014-05-31 NOTE — Telephone Encounter (Signed)
Rx(s) sent to pharmacy electronically.  

## 2014-07-03 ENCOUNTER — Other Ambulatory Visit: Payer: Self-pay | Admitting: Cardiology

## 2014-07-07 ENCOUNTER — Emergency Department (HOSPITAL_COMMUNITY): Payer: BC Managed Care – PPO

## 2014-07-07 ENCOUNTER — Emergency Department (HOSPITAL_COMMUNITY)
Admission: EM | Admit: 2014-07-07 | Discharge: 2014-07-07 | Disposition: A | Discharge status: Home / Self Care | Payer: BC Managed Care – PPO | Attending: Emergency Medicine | Admitting: Emergency Medicine

## 2014-07-07 ENCOUNTER — Encounter (HOSPITAL_COMMUNITY): Payer: Self-pay | Admitting: Emergency Medicine

## 2014-07-07 DIAGNOSIS — I252 Old myocardial infarction: Secondary | ICD-10-CM | POA: Diagnosis not present

## 2014-07-07 DIAGNOSIS — Z951 Presence of aortocoronary bypass graft: Secondary | ICD-10-CM | POA: Diagnosis not present

## 2014-07-07 DIAGNOSIS — Z8673 Personal history of transient ischemic attack (TIA), and cerebral infarction without residual deficits: Secondary | ICD-10-CM | POA: Diagnosis not present

## 2014-07-07 DIAGNOSIS — W010XXA Fall on same level from slipping, tripping and stumbling without subsequent striking against object, initial encounter: Secondary | ICD-10-CM | POA: Insufficient documentation

## 2014-07-07 DIAGNOSIS — Y929 Unspecified place or not applicable: Secondary | ICD-10-CM | POA: Insufficient documentation

## 2014-07-07 DIAGNOSIS — S32010A Wedge compression fracture of first lumbar vertebra, initial encounter for closed fracture: Secondary | ICD-10-CM | POA: Insufficient documentation

## 2014-07-07 DIAGNOSIS — E785 Hyperlipidemia, unspecified: Secondary | ICD-10-CM | POA: Insufficient documentation

## 2014-07-07 DIAGNOSIS — E119 Type 2 diabetes mellitus without complications: Secondary | ICD-10-CM | POA: Diagnosis not present

## 2014-07-07 DIAGNOSIS — Y9301 Activity, walking, marching and hiking: Secondary | ICD-10-CM | POA: Insufficient documentation

## 2014-07-07 DIAGNOSIS — Z9889 Other specified postprocedural states: Secondary | ICD-10-CM | POA: Diagnosis not present

## 2014-07-07 DIAGNOSIS — Y999 Unspecified external cause status: Secondary | ICD-10-CM | POA: Diagnosis not present

## 2014-07-07 DIAGNOSIS — S32000A Wedge compression fracture of unspecified lumbar vertebra, initial encounter for closed fracture: Secondary | ICD-10-CM

## 2014-07-07 DIAGNOSIS — H409 Unspecified glaucoma: Secondary | ICD-10-CM | POA: Diagnosis not present

## 2014-07-07 DIAGNOSIS — Z79899 Other long term (current) drug therapy: Secondary | ICD-10-CM | POA: Diagnosis not present

## 2014-07-07 DIAGNOSIS — W19XXXA Unspecified fall, initial encounter: Secondary | ICD-10-CM

## 2014-07-07 DIAGNOSIS — I2510 Atherosclerotic heart disease of native coronary artery without angina pectoris: Secondary | ICD-10-CM | POA: Insufficient documentation

## 2014-07-07 DIAGNOSIS — S3992XA Unspecified injury of lower back, initial encounter: Secondary | ICD-10-CM | POA: Diagnosis present

## 2014-07-07 MED ORDER — DIAZEPAM 5 MG PO TABS
5.0000 mg | ORAL_TABLET | Freq: Once | ORAL | Status: AC
  Administered 2014-07-07: 5 mg via ORAL
  Filled 2014-07-07: qty 1

## 2014-07-07 MED ORDER — IBUPROFEN 600 MG PO TABS: 600.0000 mg | ORAL_TABLET | Freq: Three times a day (TID) | ORAL | Status: AC

## 2014-07-07 MED ORDER — KETOROLAC TROMETHAMINE 30 MG/ML IJ SOLN
30.0000 mg | Freq: Once | INTRAMUSCULAR | Status: AC
  Administered 2014-07-07: 30 mg via INTRAMUSCULAR
  Filled 2014-07-07: qty 1

## 2014-07-07 MED ORDER — HYDROCODONE-ACETAMINOPHEN 10-325 MG PO TABS: 1.0000 | ORAL_TABLET | ORAL | Status: DC | PRN

## 2014-07-07 NOTE — Discharge Instructions (Signed)
As discussed, with your compression fracture in your lumbar spine, pain and discomfort on the normal, but if you develop new, or concerning changes in her condition, please be sure to return here immediately.  Otherwise, please speak with your orthopedist to arrange physical therapy, ongoing pain management.  Please be sure to contact our neurosurgical colleagues as well for appropriate ongoing evaluation and management.   Back, Compression Fracture A compression fracture happens when a force is put upon the length of your spine. Slipping and falling on your bottom are examples of such a force. When this happens, sometimes the force is great enough to compress the building blocks (vertebral bodies) of your spine. Although this causes a lot of pain, this can usually be treated at home, unless your caregiver feels hospitalization is needed for pain control. Your backbone (spinal column) is made up of 24 main vertebral bodies in addition to the sacrum and coccyx (see illustration). These are held together by tough fibrous tissues (ligaments) and by support of your muscles. Nerve roots pass through the openings between the vertebrae. A sudden wrenching move, injury, or a fall may cause a compression fracture of one of the vertebral bodies. This may result in back pain or spread of pain into the belly (abdomen), the buttocks, and down the leg into the foot. Pain may also be created by muscle spasm alone. Large studies have been undertaken to determine the best possible course of action to help your back following injury and also to prevent future problems. The recommendations are as follows. FOLLOWING A COMPRESSION FRACTURE: Do the following only if advised by your caregiver.   If a back brace has been suggested or provided, wear it as directed.  Do not stop wearing the back brace unless instructed by your caregiver.  When allowed to return to regular activities, avoid a sedentary lifestyle. Actively  exercise. Sporadic weekend binges of tennis, racquetball, or waterskiing may actually aggravate or create problems, especially if you are not in condition for that activity.  Avoid sports requiring sudden body movements until you are in condition for them. Swimming and walking are safer activities.  Maintain good posture.  Avoid obesity.  If not already done, you should have a DEXA scan. Based on the results, be treated for osteoporosis. FOLLOWING ACUTE (SUDDEN) INJURY:  Only take over-the-counter or prescription medicines for pain, discomfort, or fever as directed by your caregiver.  Use bed rest for only the most extreme acute episode. Prolonged bed rest may aggravate your condition. Ice used for acute conditions is effective. Use a large plastic bag filled with ice. Wrap it in a towel. This also provides excellent pain relief. This may be continuous. Or use it for 30 minutes every 2 hours during acute phase, then as needed. Heat for 30 minutes prior to activities is helpful.  As soon as the acute phase (the time when your back is too painful for you to do normal activities) is over, it is important to resume normal activities and work Arboriculturist. Back injuries can cause potentially marked changes in lifestyle. So it is important to attack these problems aggressively.  See your caregiver for continued problems. He or she can help or refer you for appropriate exercises, physical therapy, and work hardening if needed.  If you are given narcotic medications for your condition, for the next 24 hours do not:  Drive.  Operate machinery or power tools.  Sign legal documents.  Do not drink alcohol, or take sleeping pills  or other medications that may interfere with treatment. If your caregiver has given you a follow-up appointment, it is very important to keep that appointment. Not keeping the appointment could result in a chronic or permanent injury, pain, and disability. If there is  any problem keeping the appointment, you must call back to this facility for assistance.  SEEK IMMEDIATE MEDICAL CARE IF:  You develop numbness, tingling, weakness, or problems with the use of your arms or legs.  You develop severe back pain not relieved with medications.  You have changes in bowel or bladder control.  You have increasing pain in any areas of the body. Document Released: 12/28/2004 Document Revised: 05/14/2013 Document Reviewed: 08/02/2007 [facility] Patient Information 2015 [place], [place]. This information is not intended to replace advice given to you by your health care provider. Make sure you discuss any questions you have with your health care provider.

## 2014-07-07 NOTE — ED Notes (Signed)
MD at bedside. 

## 2014-07-07 NOTE — ED Notes (Signed)
Pt reports he fell on Thursday. Was seen at that time and everything looked good. Pt began to have bilateral lower back pain this am with movement and palpation.

## 2014-07-07 NOTE — ED Provider Notes (Signed)
CSN: [id number]     Arrival date & time 07/07/14  4098 History   First MD Initiated Contact with Patient 07/07/14 [phone]     Chief Complaint  Patient presents with  . Back Pain     (Consider location/radiation/quality/duration/timing/severity/associated sxs/prior Treatment) HPI  Patient presents with pain following a fall. Pain began 3 days ago. Patient stumbled while walking. He fell onto his buttocks. Since that time he has had persistent bilateral lower back pain, worse with motion, and today upon awakening he had no capacity to stand, secondary to pain. No loss of sensation or strength in his distal lower extremities, no abdominal pain, no incontinence, no fever, no chills. No relief with topical medication, heating pads   Past Medical History  Diagnosis Date  . Acute MI, anterior wall, subsequent episode of care  1997    Failed PCI-LAD --> referred for CABG  . S/P CABG x 5 1997    LIMA-LAD, RIMA-RCA, SVG-RI-D1, SVG-OM  . CAD in native artery  2002    Cardiac cath: Ostial LAD 100%; R. I 99% proximal, Prox Cx  99%; 90-95% mid RCA.  Patent grafts  . Hyperlipidemia LDL goal < 70      on statin  . Diabetes mellitus type 2, controlled   . Glaucoma   . Neonatal stroke     With right arm palsy and subsequent seizure disorder  . Seizure disorder, secondary    Past Surgical History  Procedure Laterality Date  . Coronary artery bypass graft   1997    LIMA-LAD, RIMA-RCA, SVG-RI a-D1, SVG-OM  . Transthoracic echocardiogram   August 2012    Mildly dilated LV; EF 45-50%. Mild apical HK and mild septal HK with impaired relaxation (grade 1 diastolic function) mildly reduced RV function.  Marland Kitchen Nm myoview ltd  2010    LOW RISK. No ischemia or infarct.  . Nm myoview ltd  03/29/2013    LOW RISK: No ischemia. Limited mid anterior wall scar sparing septum and apex; suggests occluded LAD with patent distal bypass versus diagonal disease --> consistent with known CAD  . Cardiac catheterization   03/02/2000    LAD 100% occlusion: RI and circumflex--proximal 90%; mid RCA 90 a 95%. Patent LIMA-LAD, RIMA-RCA, SVG to OM, SVG-RI-D1  . Cardiac catheterization  11/27/1995    Totally occluded LAD stented with a 3x15 Multi-Link stent, deployed at 8atm for 52sec. 95% focal eccentric stenosis of the RCA. Considered CABG versus angioplasty of the RCA lesion.   Family History  Problem Relation Age of Onset  . Stroke Mother   . Heart disease Mother   . Heart failure Father   . Diabetes Father   . Heart failure Maternal Grandmother   . Heart attack Maternal Grandfather   . Diabetes Paternal Grandmother   . Diabetes Paternal Grandfather    History  Substance Use Topics  . Smoking status: Never Smoker   . Smokeless tobacco: Not on file  . Alcohol Use: No    Review of Systems  Constitutional:       Per HPI, otherwise negative  HENT:       Per HPI, otherwise negative  Respiratory:       Per HPI, otherwise negative  Cardiovascular:       Per HPI, otherwise negative  Gastrointestinal: Negative for nausea and vomiting.  Endocrine:       Negative aside from HPI  Genitourinary:       Neg aside from HPI   Musculoskeletal:  Per HPI, otherwise negative  Skin: Negative for wound.  Neurological: Negative for syncope and weakness.      Allergies  Review of patient's allergies indicates no known allergies.  Home Medications   Prior to Admission medications   Medication Sig Start Date End Date Taking? Authorizing Provider  atorvastatin (LIPITOR) 40 MG tablet TAKE 1 TABLET IN THE MORNING. 07/03/14  Yes Marykay Lex, MD  Dorzolamide HCl-Timolol Mal PF 22.3-6.8 MG/ML SOLN Place 1 drop into both eyes 2 (two) times daily.   Yes Historical Provider, MD  Insulin Glargine (TOUJEO SOLOSTAR) 300 UNIT/ML SOPN Inject 40 units of lipase into the skin every morning.   Yes Historical Provider, MD  lisinopril (PRINIVIL,ZESTRIL) 2.5 MG tablet TAKE 1 TABLET ONCE DAILY. 05/23/14  Yes Marykay Lex, MD  metformin (FORTAMET) 1000 MG (OSM) 24 hr tablet Take 1,000 mg by mouth 2 (two) times daily.   Yes Historical Provider, MD  HYDROcodone-acetaminophen (NORCO) 10-325 MG per tablet Take 1 tablet by mouth every 4 (four) hours as needed for severe pain. 07/07/14   Gerhard Munch, MD  ibuprofen (ADVIL,MOTRIN) 600 MG tablet Take 1 tablet (600 mg total) by mouth 3 (three) times daily. 07/07/14 07/09/14  Gerhard Munch, MD   BP 121/69 mmHg  Pulse 59  Temp(Src) 98 F (36.7 C) (Oral)  Resp 18  SpO2 96% Physical Exam  Constitutional: He is oriented to person, place, and time. He appears well-developed. No distress.  HENT:  Head: Normocephalic and atraumatic.  Eyes: Conjunctivae and EOM are normal.  Cardiovascular: Normal rate and regular rhythm.   Pulmonary/Chest: Effort normal. No stridor. No respiratory distress.  Abdominal: He exhibits no distension.  Musculoskeletal: He exhibits no edema.  Pelvis is stable, though there is mild tenderness to palpation about the left lateral upper iliac crest. Patient describes worsening pain with motion of the left leg, though he can flex and extend the hip independent. Knees, ankles, feet unremarkable  Neurological: He is alert and oriented to person, place, and time. No cranial nerve deficit. He exhibits normal muscle tone. Coordination normal.  Skin: Skin is warm and dry.  Psychiatric: He has a normal mood and affect.  Nursing note and vitals reviewed.   ED Course  Procedures (including critical care time) Labs Review Labs Reviewed - No data to display  Imaging Review Dg Lumbar Spine Complete  07/07/2014   CLINICAL DATA:  Larey Seat off ladder 3 days ago. Pain starting from mid lower left anterior region around to left side of the posterior lower left. No previous injury.  EXAM: LUMBAR SPINE - COMPLETE 4+ VIEW  COMPARISON:  CT of the abdomen and pelvis on 06/14/2012  FINDINGS: There is an acute wedge compression fracture of L1 with superior  endplate irregularity. Loss of anterior height is approximately 30 percent. There is no obvious retropulsion of fracture fragments. No other fractures are identified. No suspicious lytic or blastic lesions are identified. Visualized portion of the pelvis appears intact. Bowel gas pattern is nonobstructive.  IMPRESSION: Acute wedge compression fracture of L1 with 30% loss of anterior height.   Electronically Signed   By: Norva Pavlov M.D.   On: 07/07/2014 09:51    On repeat exam the patient states it feels substantially better. He, his wife and I had a lengthy conversation about follow-up instructions, return precautions.  MDM   Final diagnoses:  Fall, initial encounter  Lumbar compression fracture, closed, initial encounter   Patient presents with days after initial fall, now with increasing  pain. Patient is distally neurovascularly intact, has no acute abdominal findings. Patient had substantial pain reduction here, was discharged to follow-up with both primary orthopedics (for facilitation of PT) and neurosurgery  Gerhard Munch, MD 07/07/14 1053

## 2014-07-07 NOTE — ED Notes (Signed)
Patient transported to X-ray 

## 2014-07-07 NOTE — ED Notes (Signed)
Bed: WA03 Expected date: 07/07/14 Expected time: 8:22 AM Means of arrival: Ambulance Comments: Back pain

## 2014-09-09 ENCOUNTER — Other Ambulatory Visit: Payer: Self-pay | Admitting: Cardiology

## 2014-09-09 NOTE — Telephone Encounter (Signed)
REFILL 

## 2014-11-01 ENCOUNTER — Ambulatory Visit (INDEPENDENT_AMBULATORY_CARE_PROVIDER_SITE_OTHER): Payer: BC Managed Care – PPO | Admitting: Cardiology

## 2014-11-01 VITALS — BP 102/78 | HR 58 | Ht 64.0 in | Wt 152.7 lb

## 2014-11-01 DIAGNOSIS — Z951 Presence of aortocoronary bypass graft: Secondary | ICD-10-CM | POA: Diagnosis not present

## 2014-11-01 DIAGNOSIS — I2109 ST elevation (STEMI) myocardial infarction involving other coronary artery of anterior wall: Secondary | ICD-10-CM | POA: Diagnosis not present

## 2014-11-01 DIAGNOSIS — E785 Hyperlipidemia, unspecified: Secondary | ICD-10-CM | POA: Diagnosis not present

## 2014-11-01 DIAGNOSIS — I2510 Atherosclerotic heart disease of native coronary artery without angina pectoris: Secondary | ICD-10-CM

## 2014-11-01 MED ORDER — LISINOPRIL 2.5 MG PO TABS: ORAL_TABLET | ORAL | Status: DC

## 2014-11-01 NOTE — Patient Instructions (Signed)
MEDICATION WAS REFILLED  NO CHANGES WITH CURRENT MEDICATIONS  Your physician wants you to follow-up in 12 MONTHS WITH DR HARDING.  You will receive a reminder letter in the mail two months in advance. If you don't receive a letter, please call our office to schedule the follow-up appointment.  If you need a refill on your cardiac medications before your next appointment, please call your pharmacy.

## 2014-11-01 NOTE — Progress Notes (Signed)
PCP: Georgianne FickAMACHANDRAN,AJITH, MD  Clinic Note: Chief Complaint  Patient presents with  . Annual Exam    over due follow-up, pt denied chest pain and SOB, pt broke with knee cap in feb and in fracture his back in June  . Coronary Artery Disease    HPI: Levi Obrien is a 69 y.o. male with a PMH below who presents today for ~18 month f/u for CAD-CABG. Myoview in March 2015 showed MID LAD distribution infarction consistent with known anatomy.  The stress test reading physician actually suggested that the findings suggestive of flush occluded LAD with patent distal bypass versus diagonal artery disease.  Levi Obrien was last seen in April 2015.  He was doing quite well at that time and lost a significant amount of weight = 17 pounds that time. Aggressive diet with exercise up to 2 miles per day along with your work. Stable from cardiac standpoint. His PCP is also adjusted his insulin/TOUJEO dosing. Since then his glycemic control has been much better.  Recent Hospitalizations: ER visit in June 2016 for a fall -- landed on his buttocks -- fractured back (fell off ladder - lost balance) -- wore a brace & pain meds Feb  Fell & broke R knee cap. - tripped  Studies Reviewed: no new studies  Interval History: Doing well from a CV standpoint.  Having a hard time finding Healthy food when eating out -- but is keeping himself disciplined.  Weight now stabilized.  He continues to remain active walking around on the job site. Not necessarily getting routine exercise. He really says the only thing that he can definitely tell a difference is that he is not able to get out and do the same instrument used to do. His exhaustion during high intensity days.  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  Up & down stairs.    No anginal chest pain or shortness of breath with rest or exertion.  No PND, orthopnea or edema. No palpitations, lightheadedness, dizziness,  weakness or syncope/near syncope. No TIA/amaurosis fugax symptoms. No melena, hematochezia, hematuria, or epstaxis. No claudication.  ROS: A comprehensive was performed. Review of Systems  Constitutional: Negative for malaise/fatigue.  HENT: Negative for nosebleeds.   Respiratory: Negative for shortness of breath.   Cardiovascular: Negative for palpitations and claudication.  Gastrointestinal: Negative for blood in stool and melena.  Genitourinary: Negative for hematuria.  Musculoskeletal: Positive for joint pain (Right knee pain) and falls.  Neurological: Negative for dizziness, weakness and headaches.  Endo/Heme/Allergies: Does not bruise/bleed easily.  Psychiatric/Behavioral: Negative.   All other systems reviewed and are negative.   Past Medical History  Diagnosis Date  . Acute MI, anterior wall, subsequent episode of care (HCC)  1997    Failed PCI-LAD --> referred for CABG  . S/P CABG x 5 1997    LIMA-LAD, RIMA-RCA, SVG-RI-D1, SVG-OM  . CAD in native artery  2002    Cardiac cath: Ostial LAD 100%; R. I 99% proximal, Prox Cx  99%; 90-95% mid RCA.  Patent grafts  . Hyperlipidemia LDL goal < 70      on statin  . Diabetes mellitus type 2, controlled (HCC)   . Glaucoma   . 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 Seizure disorder, secondary [facility](HCC)     Past Surgical History  Procedure Laterality Date  . Coronary artery bypass graft   1997  LIMA-LAD, RIMA-RCA, SVG-RI a-D1, SVG-OM  . Transthoracic echocardiogram   August 2012    Mildly dilated LV; EF 45-50%. Mild apical HK and mild septal HK with impaired relaxation (grade 1 diastolic function) mildly reduced RV function.  Marland Kitchen Nm myoview ltd  2010    LOW RISK. No ischemia or infarct.  . Nm myoview ltd  03/29/2013    LOW RISK: No ischemia. Limited mid anterior wall scar sparing septum and apex; suggests occluded LAD with patent distal bypass versus diagonal disease --> consistent with  known CAD  . Cardiac catheterization  03/02/2000    LAD 100% occlusion: RI and circumflex--proximal 90%; mid RCA 90 a 95%. Patent LIMA-LAD, RIMA-RCA, SVG to OM, SVG-RI-D1  . Cardiac catheterization  11/27/1995    Totally occluded LAD stented with a 3x15 Multi-Link stent, deployed at 8atm for 52sec. 95% focal eccentric stenosis of the RCA. Considered CABG versus angioplasty of the RCA lesion.   Prior to Admission medications   Medication Sig Start Date End Date Taking? Authorizing Provider  aspirin 81 MG tablet Take 1 tablet by mouth daily. 01/28/09  Yes Historical Provider, MD  atorvastatin (LIPITOR) 40 MG tablet Take 1 tablet (40 mg total) by mouth daily at 6 PM. KEEP OV. 09/09/14  Yes Marykay Lex, MD  Dorzolamide HCl-Timolol Mal PF 22.3-6.8 MG/ML SOLN Place 1 drop into both eyes 2 (two) times daily.   Yes Historical Provider, MD  Insulin Glargine (TOUJEO SOLOSTAR) 300 UNIT/ML SOPN Inject 40 units of lipase into the skin every morning.   Yes Historical Provider, MD  lisinopril (PRINIVIL,ZESTRIL) 2.5 MG tablet TAKE 1 TABLET ONCE DAILY. 11/01/14  Yes Marykay Lex, MD  metformin (FORTAMET) 1000 MG (OSM) 24 hr tablet Take 1,000 mg by mouth 2 (two) times daily.   Yes Historical Provider, MD   No Known Allergies   Social History   Social History  . Marital Status: Married    Spouse Name: N/A  . Number of Children: N/A  . Years of Education: N/A   Social History Main Topics  . Smoking status: Never Smoker   . Smokeless tobacco: None  . Alcohol Use: No  . Drug Use: No  . Sexual Activity: Not Asked   Other Topics Concern  . None   Social History Narrative   He is a married father of 2. He walks daily and gets plan exercise. Never smoked and does not drink alcohol.  ** He is very proud to announce that he has a brand-new grandson just several months old.   Family History  Problem Relation Age of Onset  . Stroke Mother   . Heart disease Mother   . Heart failure Father   .  Diabetes Father   . Heart failure Maternal Grandmother   . Heart attack Maternal Grandfather   . Diabetes Paternal Grandmother   . Diabetes Paternal Grandfather      Wt Readings from Last 3 Encounters:  11/01/14 152 lb 11.2 oz (69.264 kg)  05/02/13 148 lb (67.132 kg)  03/29/13 148 lb 9.6 oz (67.405 kg)    PHYSICAL EXAM BP 102/78 mmHg  Pulse 58  Ht  (1.626 m)  Wt 152 lb 11.2 oz (69.264 kg)  BMI 26.20 kg/m2 General appearance: alert, cooperative, appears stated age, no distress and Pleasant mood and affect. Well-nourished well-groomed. HEENT: [place]/AT, EOMI, MMM, anicteric sclera (he has colored his hair blonde) Neck: no adenopathy, no carotid bruit, no JVD, supple, symmetrical, trachea midline and thyroid not enlarged, symmetric,  no tenderness/mass/nodules Lungs: CTAB, normal percussion bilaterally and Nonlabored, good air movement Heart: RRR, S1, S2 normal, no murmur, click, rub or gallop and normal apical impulse Abdomen: soft, non-tender; bowel sounds normal; no masses, no organomegaly Extremities: No clubbing cyanosis or edema. He does have the significant atrophy of the right upper extremity and shoulder from his neonatal stroke. Pulses: 2+ and symmetric Skin: Diffuse macular erythematous rash on bilateral upper extremities. Neurologic: Cranial nerves: normal a    Adult ECG Report  Rate: 58 ;  Rhythm: sinus bradycardia and Left Axis Deviation (-36). Otherwise normal intervals, durations. Q waves in lead 3 only as well as poor anterior R-wave progression.;   Narrative Interpretation: Stable EKG. No acute changes   Other studies Reviewed: Additional studies/ records that were reviewed today include:  Recent Labs:  Checked by PCP - not currently available   ASSESSMENT / PLAN: Doing well, no active anginal heart failure symptoms. On stable medications. Problem List Items Addressed This Visit    S/P CABG x 5   Relevant Orders   EKG 12-Lead   Hyperlipidemia LDL goal  <70    On statin. Labs monitored by PCP, not available. Had previously been well controlled by the reports that had available.      Relevant Medications   aspirin 81 MG tablet   lisinopril (PRINIVIL,ZESTRIL) 2.5 MG tablet   Other Relevant Orders   EKG 12-Lead   History of MI, anterior wall => 1997. Failed LAD PCI, treated with CABG (Chronic)    History big anterior MI, but only mildly reduced EF. She is recovering quite well without any symptoms of ischemic cardiomyopathy. Clear evidence of prior infarct on Myoview but no ischemia. Is not on any significant medications besides statin and ACE inhibitor. Doing very well for many years.      Relevant Medications   aspirin 81 MG tablet   lisinopril (PRINIVIL,ZESTRIL) 2.5 MG tablet   CAD in native artery; occluded LAD, subtotal occlusion of ostial RI, proximal Cx, mid RCA - Primary (Chronic)    Again no recurrent anginal symptoms. On aspirin plus statin. No beta blocker due to bradycardia and fatigue in the past. On low-dose ACE inhibitor. With negative Myoview in 2015, would wait at least until 2019 to recheck stress test in the absence of symptoms.      Relevant Medications   aspirin 81 MG tablet   lisinopril (PRINIVIL,ZESTRIL) 2.5 MG tablet   Other Relevant Orders   EKG 12-Lead      Current medicines are reviewed at length with the patient today. (+/- concerns) none The following changes have been made: None   Studies Ordered:   Orders Placed This Encounter  Procedures  . EKG 12-Lead    ROV  1 yr  Marykay Lex, M.D., M.S. Interventional Cardiologist   Pager # [phone]

## 2014-11-03 ENCOUNTER — Encounter: Payer: Self-pay | Admitting: Cardiology

## 2014-11-03 NOTE — Assessment & Plan Note (Signed)
On statin. Labs monitored by PCP, not available. Had previously been well controlled by the reports that had available.

## 2014-11-03 NOTE — Assessment & Plan Note (Signed)
History big anterior MI, but only mildly reduced EF. She is recovering quite well without any symptoms of ischemic cardiomyopathy. Clear evidence of prior infarct on Myoview but no ischemia. Is not on any significant medications besides statin and ACE inhibitor. Doing very well for many years.

## 2014-11-03 NOTE — Assessment & Plan Note (Signed)
Again no recurrent anginal symptoms. On aspirin plus statin. No beta blocker due to bradycardia and fatigue in the past. On low-dose ACE inhibitor. With negative Myoview in 2015, would wait at least until 2019 to recheck stress test in the absence of symptoms.

## 2014-12-28 ENCOUNTER — Other Ambulatory Visit: Payer: Self-pay | Admitting: Cardiology

## 2015-04-19 IMAGING — CT CT ABD-PELV W/O CM
2 of 4 series · 13 of 32 positions shown, 18 images · non-contrast
Comparison: Abdominal radiograph from 07/03/2008, and abdominal
ultrasound performed 06/28/2008

CLINICAL DATA: Severe left lower quadrant abdominal pain and left
flank pain.

CT ABDOMEN AND PELVIS WITHOUT CONTRAST
TECHNIQUE: Multidetector CT imaging of the abdomen and pelvis was
performed following the standard protocol without intravenous
contrast.

[Series 2: routine abdomen · axial · 0.70mm/px · z∈[-412,-82]mm · 7 of 87 slices shown, 12 images]
[im 11/87  soft-tissue]
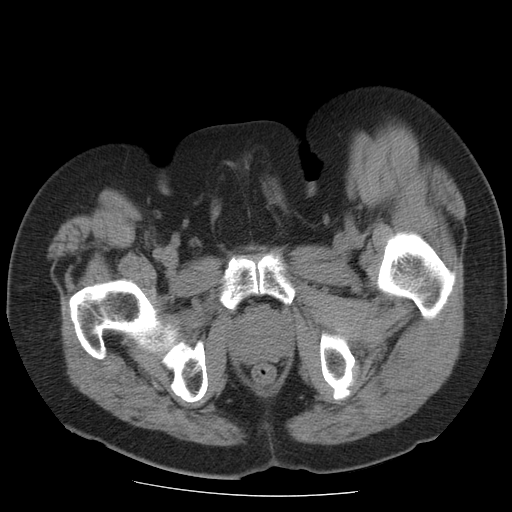
[im 11/87  bone]
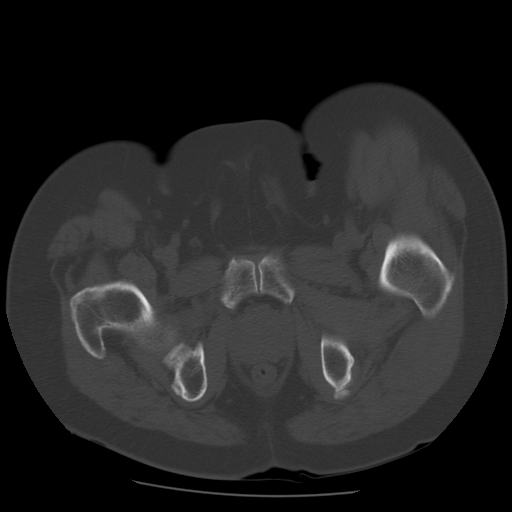
[im 22/87  soft-tissue]
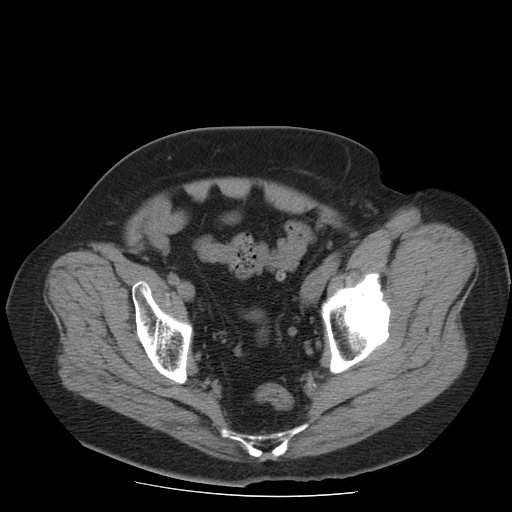
[im 33/87  soft-tissue]
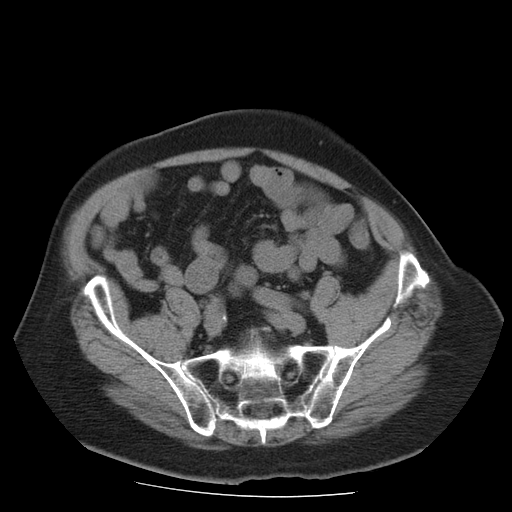
[im 44/87  soft-tissue]
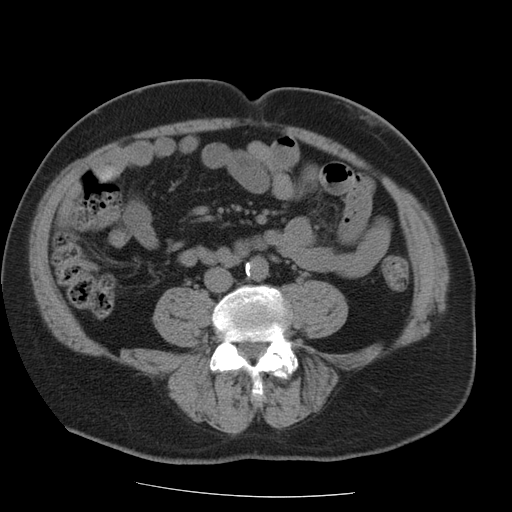
[im 44/87  lung]
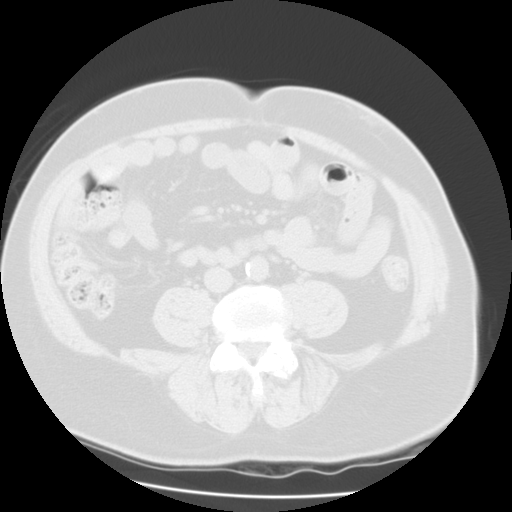
[im 54/87  soft-tissue]
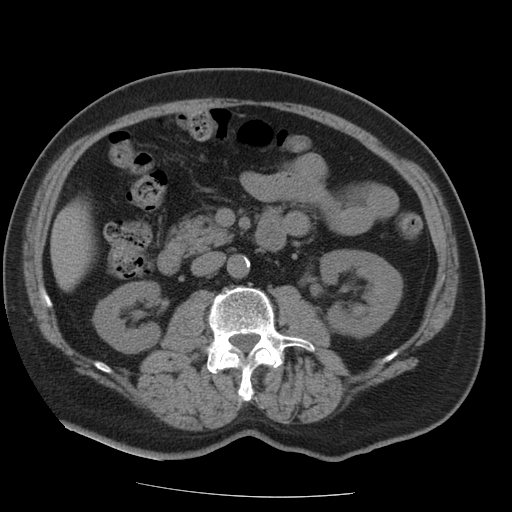
[im 54/87  lung]
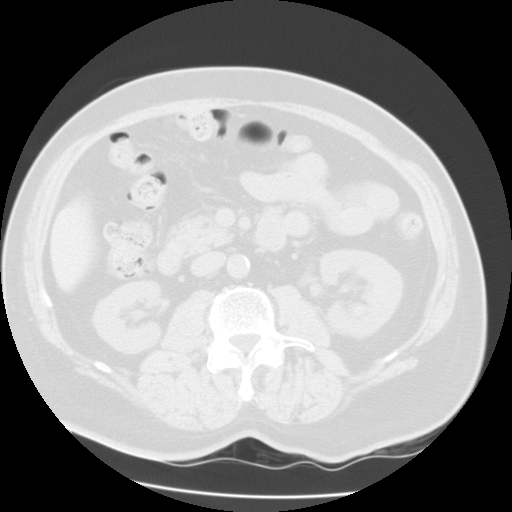
[im 65/87  soft-tissue]
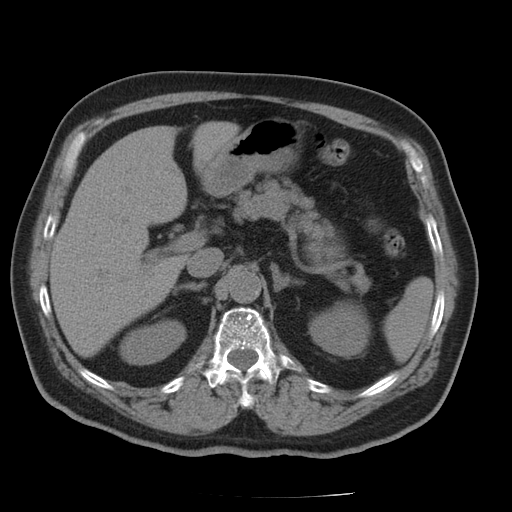
[im 65/87  lung]
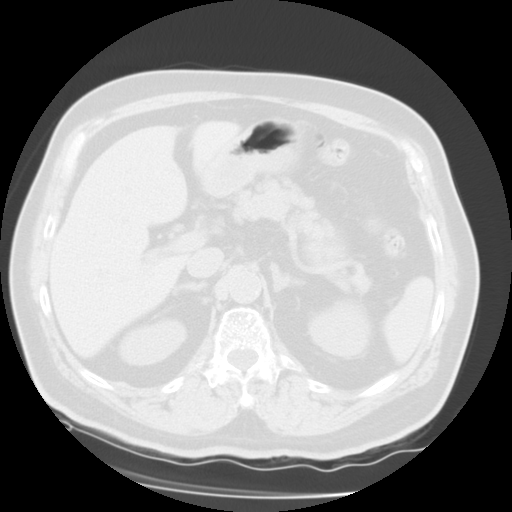
[im 76/87  soft-tissue]
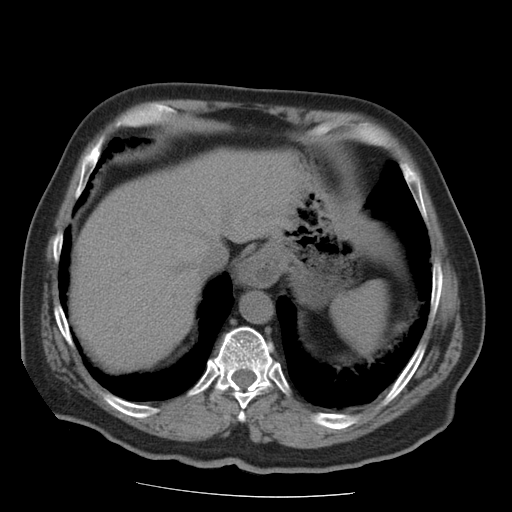
[im 76/87  lung]
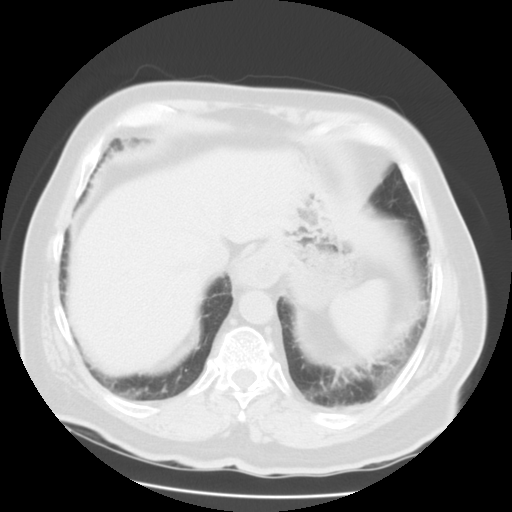

[Series 401: sag · sagittal · 0.87mm/px · 6 of 112 slices shown]
[im 12/112  soft-tissue]
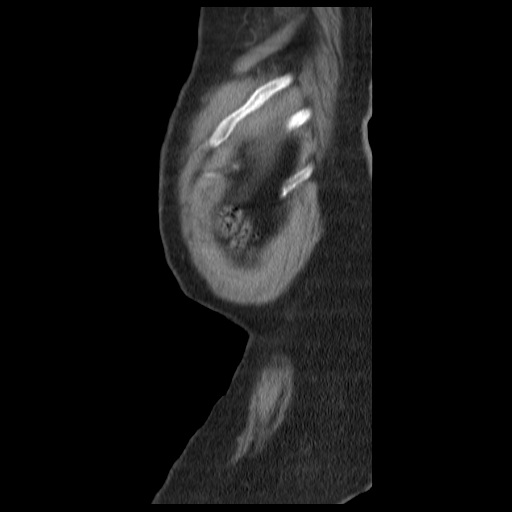
[im 23/112  soft-tissue]
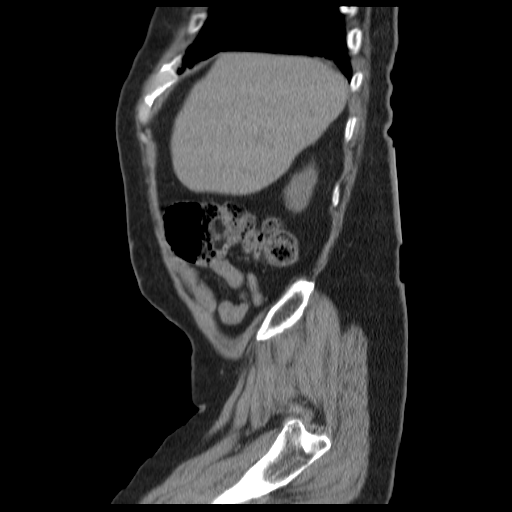
[im 34/112  soft-tissue]
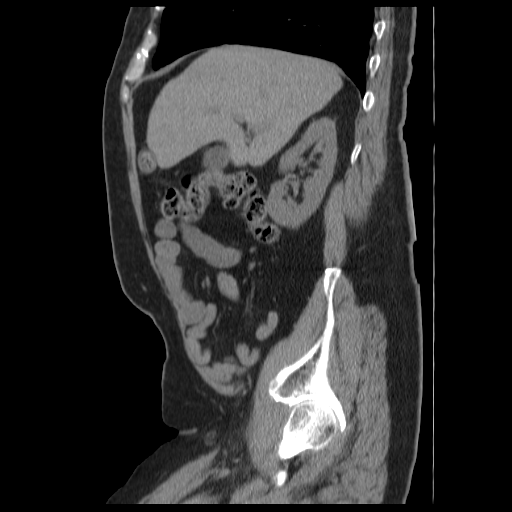
[im 45/112  soft-tissue]
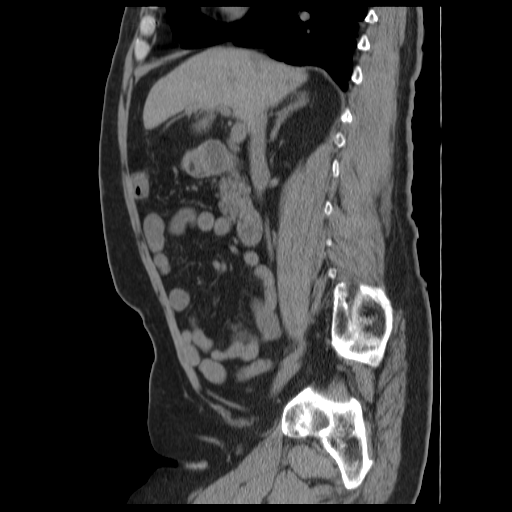
[im 67/112  soft-tissue]
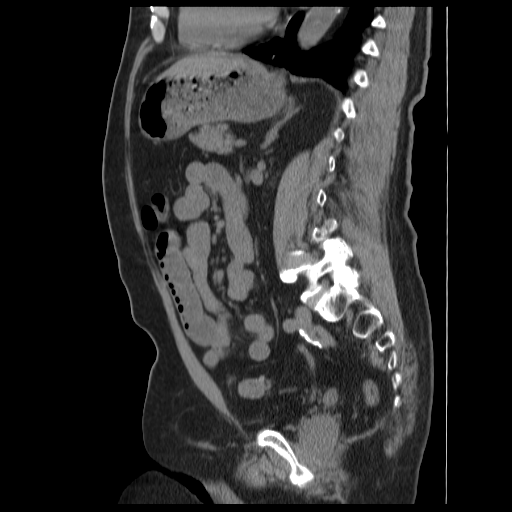
[im 78/112  soft-tissue]
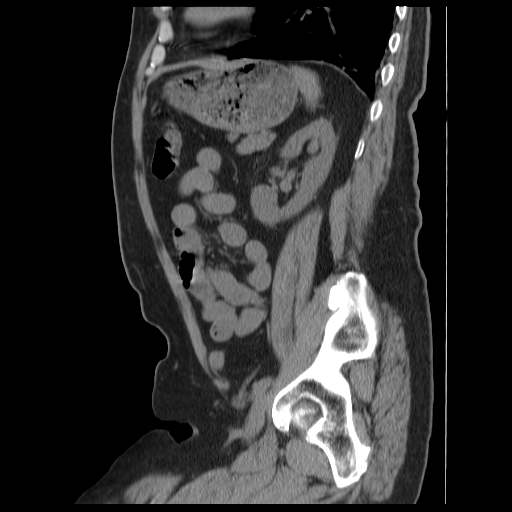

[13 of 32 positions shown; findings below may reference images not displayed]

FINDINGS: Mild bibasilar atelectasis or scarring is noted.  The
patient is status post median sternotomy.  A tiny hiatal hernia is
noted.

A small calcified granuloma is noted at the periphery of the
hepatic dome; the liver is otherwise unremarkable.  A small 2.8 cm
hypodensity within the spleen may reflect a small cyst, but is
nonspecific in appearance.  The gallbladder is within normal
limits.  The pancreas and adrenal glands are unremarkable.

There is mild left-sided hydronephrosis, with stranding along the
course of the left ureter, and a small obstructing 3 mm stone just
proximal to the left vesicoureteral junction.  Minimal nonspecific
perinephric stranding is noted bilaterally.  The right kidney is
otherwise unremarkable in appearance.  No nonobstructing renal
stones are identified.

No free fluid is identified.  The small bowel is unremarkable in
appearance.  The stomach is otherwise unremarkable.  No acute
vascular abnormalities are seen.  Scattered calcification is noted
along the abdominal aorta and its branches.

The appendix is not definitely seen; there is no evidence for
appendicitis.  A single diverticulum is noted along the descending
colon, and minimal diverticulosis is noted at the proximal sigmoid
colon, without evidence of diverticulitis.

The bladder is decompressed and not well assessed.  The prostate is
enlarged, measuring 5.5 cm in transverse dimension.  No inguinal
lymphadenopathy is seen.

No acute osseous abnormalities are identified.
IMPRESSION: 1.  Mild left-sided hydronephrosis, with a small obstructing 3 mm
ureteral stone noted distally, just proximal to the left
vesicoureteral junction.
2.  Scattered calcification along the abdominal aorta and its
branches.
3.  Minimal diverticulosis of the proximal sigmoid colon, without
evidence of diverticulitis.
4.  Enlarged prostate noted.
5.  Tiny hiatal hernia seen.
6.  Mild bibasilar atelectasis or scarring noted.

## 2015-08-14 ENCOUNTER — Other Ambulatory Visit: Payer: Self-pay | Admitting: Cardiology

## 2015-11-18 ENCOUNTER — Encounter: Payer: Self-pay | Admitting: Cardiology

## 2015-11-21 ENCOUNTER — Other Ambulatory Visit: Payer: Self-pay | Admitting: Cardiology

## 2015-11-21 ENCOUNTER — Encounter: Payer: Self-pay | Admitting: Cardiology

## 2015-11-21 ENCOUNTER — Ambulatory Visit (INDEPENDENT_AMBULATORY_CARE_PROVIDER_SITE_OTHER): Payer: BC Managed Care – PPO | Admitting: Cardiology

## 2015-11-21 VITALS — BP 116/72 | HR 60 | Ht 64.0 in | Wt 154.8 lb

## 2015-11-21 DIAGNOSIS — I2510 Atherosclerotic heart disease of native coronary artery without angina pectoris: Secondary | ICD-10-CM

## 2015-11-21 DIAGNOSIS — E785 Hyperlipidemia, unspecified: Secondary | ICD-10-CM

## 2015-11-21 DIAGNOSIS — I2109 ST elevation (STEMI) myocardial infarction involving other coronary artery of anterior wall: Secondary | ICD-10-CM | POA: Diagnosis not present

## 2015-11-21 DIAGNOSIS — Z951 Presence of aortocoronary bypass graft: Secondary | ICD-10-CM

## 2015-11-21 NOTE — Progress Notes (Signed)
PCP: Georgianne FickAMACHANDRAN,AJITH, MD  Clinic Note: Chief Complaint  Patient presents with  . Follow-up  . Coronary Artery Disease    HPI: Levi Obrien is a 70 y.o. male with a PMH below who presents today for 1 year follow-up of his history of CAD-CABG.  Myoview March 2015 showed mild LAD distribution infarction consistent with known. No ischemia. This is probably suggestive of flush occluded LAD with distal bypass patent versus occluded diagonal..  Levi Highduardo G Mentzer was last seen on October 2016 - was doing relatively well from a cardiovascular standpoint.  Recent Hospitalizations: n/a  Studies Reviewed: n/a  Interval History: Doing well - no cardiac Sx. Happy to have his DM-2 control regimen stable - glycemic control improved with current complicated Rx.  Back to work [employer] - notes some fatigue over the summer. Got a good deal with amenities.  Does lots of walking around -- notes that he tires out.    No chest pain or shortness of breath with rest or exertion. No PND, orthopnea or edema. No palpitations, lightheadedness, dizziness, weakness or syncope/near syncope. No TIA/amaurosis fugax symptoms. No claudication.  ROS: A comprehensive was performed. Review of Systems  Constitutional: Negative for malaise/fatigue.  HENT: Negative for congestion and hearing loss.   Respiratory: Negative for cough, shortness of breath and wheezing.   Cardiovascular: Negative.        Per history of present illness  Gastrointestinal: Negative for blood in stool and melena.  Genitourinary: Negative for hematuria.  Musculoskeletal: Negative for falls, joint pain and myalgias.  Neurological: Positive for dizziness (Only when he was hypoglycemic. His sugars this is not happening.). Negative for loss of consciousness.  Psychiatric/Behavioral: Negative for depression and memory loss. The patient is not nervous/anxious and does not have insomnia.   All other systems reviewed and are  negative.   Past Medical History:  Diagnosis Date  . Acute MI, anterior wall, subsequent episode of care (HCC)  1997   Failed PCI-LAD --> referred for CABG  . CAD in native artery  2002   Cardiac cath: Ostial LAD 100%; R. I 99% proximal, Prox Cx  99%; 90-95% mid RCA.  Patent grafts  . Diabetes mellitus type 2, controlled (HCC)   . Glaucoma   . Hyperlipidemia LDL goal < 70     on statin  . Neonatal stroke [facility](HCC)    With right arm palsy and subsequent seizure disorder  . S/P CABG x 5 1997   LIMA-LAD, RIMA-RCA, SVG-RI-D1, SVG-OM  . Seizure disorder, secondary [facility](HCC)     Past Surgical History:  Procedure Laterality Date  . CARDIAC CATHETERIZATION  03/02/2000   LAD 100% occlusion: RI and circumflex--proximal 90%; mid RCA 90 a 95%. Patent LIMA-LAD, RIMA-RCA, SVG to OM, SVG-RI-D1  . CARDIAC CATHETERIZATION  11/27/1995   Totally occluded LAD stented with a 3x15 Multi-Link stent, deployed at 8atm for 52sec. 95% focal eccentric stenosis of the RCA. Considered CABG versus angioplasty of the RCA lesion.  . CORONARY ARTERY BYPASS GRAFT   1997   LIMA-LAD, RIMA-RCA, SVG-RI a-D1, SVG-OM  . NM MYOVIEW LTD  2010   LOW RISK. No ischemia or infarct.  Marland Kitchen. NM MYOVIEW LTD  03/29/2013   LOW RISK: No ischemia. Limited mid anterior wall scar sparing septum and apex; suggests occluded LAD with patent distal bypass versus diagonal disease --> consistent with known CAD  . TRANSTHORACIC ECHOCARDIOGRAM   August 2012   Mildly dilated LV; EF 45-50%. Mild apical HK and mild septal HK  with impaired relaxation (grade 1 diastolic function) mildly reduced RV function.   Prior to Admission medications   Medication Sig Start Date Taking? Authorizing Provider  aspirin 81 MG tablet Take 1 tablet by mouth daily. 01/28/09 Yes Historical Provider, MD  atorvastatin (LIPITOR) 40 MG tablet TAKE 1 TABLET AT 6PM. 08/14/15 Yes Marykay Lexavid W Harding, MD  Dorzolamide HCl-Timolol Mal PF 22.3-6.8 MG/ML SOLN Place 1 drop into both eyes 2 (two)  times daily.  Yes Historical Provider, MD  Insulin Aspart (NOVOLOG FLEXPEN [place]) Inject 10 Units into the skin every evening.  Yes Historical Provider, MD  Insulin Glargine (TOUJEO SOLOSTAR) 300 UNIT/ML SOPN Inject 40 units of lipase into the skin every morning.  Yes Historical Provider, MD  lisinopril (PRINIVIL,ZESTRIL) 2.5 MG tablet TAKE 1 TABLET ONCE DAILY. 11/21/15 Yes Marykay Lexavid W Harding, MD  metformin (FORTAMET) 1000 MG (OSM) 24 hr tablet Take 1,000 mg by mouth 2 (two) times daily.  Yes Historical Provider, MD  PRESCRIPTION MEDICATION Take 200 mg by mouth every evening. Med Name: XIGDUO  Yes Historical Provider, MD  PRESCRIPTION MEDICATION Inject 45 Units into the skin every morning. Med Name: BASAGLOAR  Yes Historical Provider, MD   No Known Allergies   Social History   Social History  . Marital status: Married    Spouse name: N/A  . Number of children: N/A  . Years of education: N/A   Social History Main Topics  . Smoking status: Never Smoker  . Smokeless tobacco: Never Used  . Alcohol use No  . Drug use: No  . Sexual activity: Not Asked   Other Topics Concern  . None   Social History Narrative   He is a married father of 2. He walks daily and gets plan exercise. Never smoked and does not drink alcohol.   Family History  Problem Relation Age of Onset  . Stroke Mother   . Heart disease Mother   . Heart failure Father   . Diabetes Father   . Heart failure Maternal Grandmother   . Heart attack Maternal Grandfather   . Diabetes Paternal Grandmother   . Diabetes Paternal Grandfather     Wt Readings from Last 3 Encounters:  11/21/15 70.2 kg (154 lb 12.8 oz)  11/01/14 69.3 kg (152 lb 11.2 oz)  05/02/13 67.1 kg (148 lb)    PHYSICAL EXAM BP 116/72   Pulse 60   Ht 5\' 4"  (1.626 m)   Wt 70.2 kg (154 lb 12.8 oz)   BMI 26.57 kg/m  General appearance: alert, cooperative, appears stated age, no distress and Pleasant mood and affect. Well-nourished well-groomed. HEENT:  [place]/AT, EOMI, MMM, anicteric sclera (he has colored his hair blonde) Neck: no adenopathy, no carotid bruit, no JVD, supple, symmetrical, trachea midline and thyroid not enlarged, symmetric, no tenderness/mass/nodules Lungs: CTAB, normal percussion bilaterally and Nonlabored, good air movement Heart: RRR, S1, S2 normal, no murmur, click, rub or gallop and normal apical impulse Abdomen: soft, non-tender; bowel sounds normal; no masses, no organomegaly Extremities: No clubbing cyanosis or edema. He does have the significant atrophy of the right upper extremity and shoulder from his neonatal stroke. Pulses: 2+ and symmetric Skin: Diffuse macular erythematous rash on bilateral upper extremities. Neurologic: Cranial nerves: normal    Adult ECG Report  Rate: 60 ;  Rhythm: normal sinus rhythm and Left axis deviation (-38). Nonspecific ST and T-wave changes. Otherwise normal intervals, durations and voltage. Borderline septal Q waves, there are small R waves.;   Narrative Interpretation: Stable EKG  Other studies Reviewed: Additional studies/ records that were reviewed today include:  Recent Labs:  Monitored by PCP    ASSESSMENT / PLAN: Problem List Items Addressed This Visit    CAD in native artery; occluded LAD, subtotal occlusion of ostial RI, proximal Cx, mid RCA - Primary (Chronic)    History of CABG for multivessel CAD. Myoview was in 2015. Prior infarct noted but no ischemia. Would not be due for another one until 2015 unless symptoms occur. Continue aspirin, statin and low-dose ACE inhibitor.      Relevant Orders   EKG 12-Lead   History of MI, anterior wall => 1997. Failed LAD PCI, treated with CABG (Chronic)    History of a large anterior MI and referred for urgent CABG after failed PCI of the LAD. Thankfully EF is preserved, but clear evidence of prior infarct on Myoview without ischemia No recurrent symptoms of heart failure or angina.  Continues to do well on minimal  medications. Continue aspirin, statin and ACE inhibitor.      Hyperlipidemia LDL goal <70 (Chronic)    He is being closely monitored for his diabetes by his PCP. Therefore labs are being followed routinely. Is on moderate dose of atorvastatin no myalgias. Had previously been very much at goal.      Relevant Orders   EKG 12-Lead   S/P CABG x 5   Relevant Orders   EKG 12-Lead      Current medicines are reviewed at length with the patient today. (+/- concerns) None The following changes have been made: None  Patient Instructions  No change with current medications   Your physician wants you to follow-up in: 12 months with Dr Herbie Baltimore. You will receive a reminder letter in the mail two months in advance. If you don't receive a letter, please call our office to schedule the follow-up appointment.   If you need a refill on your cardiac medications before your next appointment, please call your pharmacy.    Studies Ordered:   Orders Placed This Encounter  Procedures  . EKG 12-Lead      Bryan Lemma, M.D., M.S. Interventional Cardiologist   Pager # [phone] Phone # [phone] [address] [place], [place] [address]

## 2015-11-21 NOTE — Patient Instructions (Signed)
No change with current medications     Your physician wants you to follow-up in 12 months with Dr HARDING.  You will receive a reminder letter in the mail two months in advance. If you don't receive a letter, please call our office to schedule the follow-up appointment.   If you need a refill on your cardiac medications before your next appointment, please call your pharmacy.  

## 2015-11-23 ENCOUNTER — Encounter: Payer: Self-pay | Admitting: Cardiology

## 2015-11-23 NOTE — Assessment & Plan Note (Signed)
History of CABG for multivessel CAD. Myoview was in 2015. Prior infarct noted but no ischemia. Would not be due for another one until 2015 unless symptoms occur. Continue aspirin, statin and low-dose ACE inhibitor.

## 2015-11-23 NOTE — Assessment & Plan Note (Signed)
He is being closely monitored for his diabetes by his PCP. Therefore labs are being followed routinely. Is on moderate dose of atorvastatin no myalgias. Had previously been very much at goal.

## 2015-11-23 NOTE — Assessment & Plan Note (Signed)
History of a large anterior MI and referred for urgent CABG after failed PCI of the LAD. Thankfully EF is preserved, but clear evidence of prior infarct on Myoview without ischemia No recurrent symptoms of heart failure or angina.  Continues to do well on minimal medications. Continue aspirin, statin and ACE inhibitor.

## 2015-12-22 ENCOUNTER — Other Ambulatory Visit: Payer: Self-pay | Admitting: Cardiology

## 2016-02-20 ENCOUNTER — Other Ambulatory Visit: Payer: Self-pay | Admitting: Cardiology

## 2016-12-23 ENCOUNTER — Other Ambulatory Visit: Payer: Self-pay | Admitting: Cardiology

## 2017-01-18 ENCOUNTER — Telehealth: Payer: Self-pay | Admitting: Cardiology

## 2017-01-18 NOTE — Telephone Encounter (Signed)
New message °  °Pt verbalized that he is calling for RN °  °Pt has a knot at the top of chest near where the neck start °  °No sob, no chest pain : pt verbalized tha the is not having any heart related issues °  °Called device spoke to barbra no device in epic °

## 2017-01-18 NOTE — Telephone Encounter (Signed)
Left message to call back and speak to triage .  patient last  Office visit  NOV 2017.

## 2017-01-18 NOTE — Telephone Encounter (Signed)
New message  Pt verbalized that he is calling for RN  Pt has a knot at the top of chest near where the neck start  No sob, no chest pain : pt verbalized tha the is not having any heart related issues  Called device spoke to barbra no device in epic

## 2017-01-24 NOTE — Telephone Encounter (Signed)
Spoke with pt, Follow up scheduled  

## 2017-01-27 ENCOUNTER — Other Ambulatory Visit: Payer: Self-pay | Admitting: Cardiology

## 2017-02-07 ENCOUNTER — Ambulatory Visit: Payer: BC Managed Care – PPO | Admitting: Cardiology

## 2017-02-07 ENCOUNTER — Encounter: Payer: Self-pay | Admitting: Cardiology

## 2017-02-07 VITALS — BP 162/82 | HR 60 | Ht 64.5 in | Wt 155.4 lb

## 2017-02-07 DIAGNOSIS — Z951 Presence of aortocoronary bypass graft: Secondary | ICD-10-CM | POA: Diagnosis not present

## 2017-02-07 DIAGNOSIS — E785 Hyperlipidemia, unspecified: Secondary | ICD-10-CM

## 2017-02-07 DIAGNOSIS — I2510 Atherosclerotic heart disease of native coronary artery without angina pectoris: Secondary | ICD-10-CM

## 2017-02-07 DIAGNOSIS — I10 Essential (primary) hypertension: Secondary | ICD-10-CM | POA: Insufficient documentation

## 2017-02-07 DIAGNOSIS — I2109 ST elevation (STEMI) myocardial infarction involving other coronary artery of anterior wall: Secondary | ICD-10-CM

## 2017-02-07 NOTE — Progress Notes (Signed)
PCP: Georgianne Fick, MD  Clinic Note: Chief Complaint  Patient presents with  . Follow-up    No symptoms  . Coronary Artery Disease    HPI: Levi Obrien is a 72 y.o. male with a PMH below who presents today for 1 year follow-up of his history of CAD-CABG.  Myoview March 2015 showed mild LAD distribution infarction consistent with known. No ischemia. This is probably suggestive of flush occluded LAD with distal bypass patent versus occluded diagonal..  Levi Obrien was last seen on 11/21/2015  - was doing relatively well from a cardiovascular standpoint.  Recent Hospitalizations: n/a  Studies Reviewed: n/a  Interval History: Doing well - no cardiac Sx. 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  He does a lot of walking on the job, but does not really do any routine exercise.  He still tires out a little bit, but has not had any significant symptoms.  No resting or exertional chest tightness or pressure.  No resting or exertional dyspnea.  Happy to have his DM-2 control regimen stable - glycemic control improved with current complicated Rx.  Remainder of cardiac review of symptoms: No PND, orthopnea or edema.  No palpitations, lightheadedness, dizziness, weakness or syncope/near syncope. No TIA/amaurosis fugax symptoms. No claudication.  ROS: A comprehensive was performed. Review of Systems  Constitutional: Negative for malaise/fatigue.  HENT: Negative for congestion and hearing loss.   Respiratory: Negative for cough, shortness of breath and wheezing.   Cardiovascular: Negative.        Per history of present illness  Gastrointestinal: Negative for blood in stool and melena.  Genitourinary: Negative for hematuria.  Musculoskeletal: Negative for falls, joint pain and myalgias.  Neurological: Positive for dizziness (No further episodes as his glycemic control has been improved.). Negative  for loss of consciousness.  Psychiatric/Behavioral: Negative for depression and memory loss. The patient is not nervous/anxious and does not have insomnia.   All other systems reviewed and are negative.   Past Medical History:  Diagnosis Date  . Acute MI, anterior wall, subsequent episode of care (HCC)  1997   Failed PCI-LAD --> referred for CABG  . CAD in native artery  2002   Cardiac cath: Ostial LAD 100%; R. I 99% proximal, Prox Cx  99%; 90-95% mid RCA.  Patent grafts  . Diabetes mellitus type 2, controlled (HCC)   . Glaucoma   . Hyperlipidemia LDL goal < 70     on statin  . 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 S/P CABG x 5 1997   LIMA-LAD, RIMA-RCA, SVG-RI-D1, SVG-OM  . Seizure disorder, secondary [facility])     Past Surgical History:  Procedure Laterality Date  . CARDIAC CATHETERIZATION  03/02/2000   LAD 100% occlusion: RI and circumflex--proximal 90%; mid RCA 90 a 95%. Patent LIMA-LAD, RIMA-RCA, SVG to OM, SVG-RI-D1  . CARDIAC CATHETERIZATION  11/27/1995   Totally occluded LAD stented with a 3x15 Multi-Link stent, deployed at 8atm for 52sec. 95% focal eccentric stenosis of the RCA. Considered CABG versus angioplasty of the RCA lesion.  . CORONARY ARTERY BYPASS GRAFT   1997   LIMA-LAD, RIMA-RCA, SVG-RI a-D1, SVG-OM  . NM MYOVIEW LTD  2010   LOW RISK. No ischemia or infarct.  Marland Kitchen NM MYOVIEW LTD  03/29/2013   LOW RISK: No ischemia. Limited mid anterior wall scar sparing septum and apex; suggests occluded  LAD with patent distal bypass versus diagonal disease --> consistent with known CAD  . TRANSTHORACIC ECHOCARDIOGRAM   August 2012   Mildly dilated LV; EF 45-50%. Mild apical HK and mild septal HK with impaired relaxation (grade 1 diastolic function) mildly reduced RV function.    Current Outpatient Medications:  .  aspirin 81 MG tablet, Take 1 tablet by mouth daily., Disp: , Rfl:  .  atorvastatin (LIPITOR) 40 MG tablet, TAKE 1 TABLET AT  6PM., Disp: 30 tablet, Rfl: 0 .  Dapagliflozin-Metformin HCl ER (XIGDUO XR) 05-998 MG TB24, Take 2 tablets by mouth daily., Disp: , Rfl:  .  Dorzolamide HCl-Timolol Mal PF 22.3-6.8 MG/ML SOLN, Place 1 drop into both eyes 2 (two) times daily., Disp: , Rfl:  .  Insulin Glargine (BASAGLAR KWIKPEN) 100 UNIT/ML SOPN, Inject into the skin at bedtime. Pt takes 45 units in the morning and 10 units dinner time, Disp: , Rfl:  .  lisinopril (PRINIVIL,ZESTRIL) 2.5 MG tablet, TAKE 1 TABLET ONCE DAILY., Disp: 90 tablet, Rfl: 3 .  metformin (FORTAMET) 1000 MG (OSM) 24 hr tablet, Take 1,000 mg by mouth 2 (two) times daily., Disp: , Rfl:  .  PRESCRIPTION MEDICATION, Take 200 mg by mouth every evening. Med Name: XIGDUO, Disp: , Rfl:  No Known Allergies   Social History   Socioeconomic History  . Marital status: Married    Spouse name: None  . Number of children: None  . Years of education: None  . Highest education level: None  Social Needs  . Financial resource strain: None  . Food insecurity - worry: None  . Food insecurity - inability: None  . Transportation needs - medical: None  . Transportation needs - non-medical: None  Occupational History  . None  Tobacco Use  . Smoking status: Never Smoker  . Smokeless tobacco: Never Used  Substance and Sexual Activity  . Alcohol use: No    Alcohol/week: 0.0 oz  . Drug use: No  . Sexual activity: None  Other Topics Concern  . None  Social History Narrative   He is a married father of 2. He walks daily and gets plan exercise. Never smoked and does not drink alcohol.   Family History  Problem Relation Age of Onset  . Stroke Mother   . Heart disease Mother   . Heart failure Father   . Diabetes Father   . Heart failure Maternal Grandmother   . Heart attack Maternal Grandfather   . Diabetes Paternal Grandmother   . Diabetes Paternal Grandfather     Wt Readings from Last 3 Encounters:  02/07/17 155 lb 6.4 oz (70.5 kg)  11/21/15 154 lb 12.8 oz  (70.2 kg)  11/01/14 152 lb 11.2 oz (69.3 kg)    PHYSICAL EXAM BP (!) 162/82   Pulse 60   Ht 5' 4.5" (1.638 m)   Wt 155 lb 6.4 oz (70.5 kg)   BMI 26.26 kg/m   Physical Exam  Constitutional: He is oriented to person, place, and time. He appears well-developed and well-nourished. No distress.  Healthy-appearing.  Well-groomed  HENT:  Head: Normocephalic and atraumatic.  Mouth/Throat: No oropharyngeal exudate.  Eyes: Conjunctivae and EOM are normal.  Neck: No hepatojugular reflux and no JVD present. Carotid bruit is not present.  Cardiovascular: Normal rate, regular rhythm, normal heart sounds and normal pulses.  No extrasystoles are present. PMI is not displaced. Exam reveals no gallop and no friction rub.  No murmur heard. Pulmonary/Chest: Effort normal and breath sounds normal.  No respiratory distress. He has no wheezes. He has no rales.  Abdominal: Soft. Bowel sounds are normal. He exhibits no distension. There is no tenderness. There is no rebound.  Musculoskeletal: Normal range of motion. He exhibits no edema.  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  Skin: Skin is warm and dry.  Psychiatric: He has a normal mood and affect. His behavior is normal. Judgment and thought content normal.  Nursing note and vitals reviewed.    Adult ECG Report  Rate: 60 ;  Rhythm: normal sinus rhythm and Left axis deviation (-42). Nonspecific ST and T-wave changes.  Cannot exclude septal MI, age undetermined.  Otherwise normal intervals, durations and voltage. Borderline septal Q waves, there are small R waves.;  Narrative Interpretation: Stable EKG  Other studies Reviewed: Additional studies/ records that were reviewed today include:  Recent Labs:  Monitored by PCP  -- Oct 2018  No results found for: CHOL, HDL, LDLCALC, LDLDIRECT, TRIG, CHOLHDL Lipid panel February/September 2018: HDL 40, LDL 56, total  cholesterol 118, triglycerides 124.  A1c 6.6.  ASSESSMENT / PLAN: Problem List Items Addressed This Visit    CAD in native artery; occluded LAD, subtotal occlusion of ostial RI, proximal Cx, mid RCA - Primary (Chronic)    History of CABG for multivessel CAD.  Last Myoview was in 2015.  Showed evidence of prior infarct but no ischemia. He currently is not having any active symptoms of angina.  He is on stable dose of ACE inhibitor along with statin and aspirin. --Not on beta-blocker because of history of fatigue and bradycardia.  Anticipate Myoview evaluation after next follow-up      Relevant Orders   EKG 12-Lead (Completed)   Essential hypertension (Chronic)    Blood pressure not quite at goal here today.  He says usually it is better controlled but was somewhat rushed and in a hurry today.  He is due to follow-up with his PCP soon.  If blood pressure continues to be over 140/90 mmHg, would strongly consider increasing lisinopril to 5 mg daily.      History of MI, anterior wall => 1997. Failed LAD PCI, treated with CABG (Chronic)   Relevant Orders   EKG 12-Lead (Completed)   Hyperlipidemia LDL goal <70 (Chronic)    Labs are being followed closely by PCP.  He is on moderate dose atorvastatin.   By recent check, labs are well at goal.  No change.      S/P CABG x 5 (Chronic)    Due for follow-up Myoview this year or early next year.      Relevant Orders   EKG 12-Lead (Completed)      Current medicines are reviewed at length with the patient today. (+/- concerns) None The following changes have been made: None  Patient Instructions  NO CHANGE WITH MEDICATION   Your physician wants you to follow-up in 12 MONTH DR HARDING. You will receive a reminder letter in the mail two months in advance. If you don't receive a letter, please call our office to schedule the follow-up appointment.    If you need a refill on your cardiac medications before your next appointment, please  call your pharmacy.    Studies Ordered:   Orders Placed This Encounter  Procedures  . EKG 12-Lead      Bryan Lemmaavid Harding, M.D., M.S. Interventional Cardiologist   Pager # [phone] Phone # [phone] [address] [place], [place] [address]

## 2017-02-07 NOTE — Patient Instructions (Addendum)
NO CHANGE WITH MEDICATION   Your physician wants you to follow-up in 12 MONTH DR HARDING. You will receive a reminder letter in the mail two months in advance. If you don't receive a letter, please call our office to schedule the follow-up appointment.    If you need a refill on your cardiac medications before your next appointment, please call your pharmacy.

## 2017-02-08 ENCOUNTER — Encounter: Payer: Self-pay | Admitting: Cardiology

## 2017-02-08 NOTE — Assessment & Plan Note (Signed)
Labs are being followed closely by PCP.  He is on moderate dose atorvastatin.   By recent check, labs are well at goal.  No change.

## 2017-02-08 NOTE — Assessment & Plan Note (Signed)
Due for follow-up Myoview this year or early next year.

## 2017-02-08 NOTE — Assessment & Plan Note (Signed)
History of CABG for multivessel CAD.  Last Myoview was in 2015.  Showed evidence of prior infarct but no ischemia. He currently is not having any active symptoms of angina.  He is on stable dose of ACE inhibitor along with statin and aspirin. --Not on beta-blocker because of history of fatigue and bradycardia.  Anticipate Myoview evaluation after next follow-up

## 2017-02-08 NOTE — Assessment & Plan Note (Signed)
Blood pressure not quite at goal here today.  He says usually it is better controlled but was somewhat rushed and in a hurry today.  He is due to follow-up with his PCP soon.  If blood pressure continues to be over 140/90 mmHg, would strongly consider increasing lisinopril to 5 mg daily.

## 2017-02-18 ENCOUNTER — Other Ambulatory Visit: Payer: Self-pay | Admitting: Cardiology

## 2017-02-21 NOTE — Telephone Encounter (Signed)
REFILL 

## 2017-02-25 ENCOUNTER — Other Ambulatory Visit: Payer: Self-pay | Admitting: Cardiology

## 2017-03-31 ENCOUNTER — Other Ambulatory Visit: Payer: Self-pay | Admitting: Cardiology

## 2017-04-30 ENCOUNTER — Other Ambulatory Visit: Payer: Self-pay | Admitting: Cardiology

## 2017-10-21 ENCOUNTER — Ambulatory Visit: Payer: BC Managed Care – PPO | Admitting: Podiatry

## 2017-10-21 ENCOUNTER — Encounter: Payer: Self-pay | Admitting: Podiatry

## 2017-10-21 VITALS — BP 150/72 | HR 58 | Resp 16

## 2017-10-21 DIAGNOSIS — L600 Ingrowing nail: Secondary | ICD-10-CM | POA: Diagnosis not present

## 2017-10-21 MED ORDER — NEOMYCIN-POLYMYXIN-HC 3.5-10000-1 OT SOLN: OTIC | 0 refills | Status: DC

## 2017-10-21 NOTE — Progress Notes (Signed)
   Subjective:    Patient ID: Levi Obrien, male    DOB: Jul 01, 1945, 72 y.o.   MRN: [id number]  HPI    Review of Systems  All other systems reviewed and are negative.      Objective:   Physical Exam        Assessment & Plan:

## 2017-10-21 NOTE — Patient Instructions (Signed)

## 2017-10-21 NOTE — Progress Notes (Signed)
Subjective:   Patient ID: Levi Obrien, male   DOB: 72 y.o.   MRN: [id number]   HPI Patient presents stating that that is very painful ingrown toenail my left big toe and I tried to trim and soak it without relief and my sugars been under excellent control with A1c below 6.  Patient does not smoke likes to be active   Review of Systems  All other systems reviewed and are negative.       Objective:  Physical Exam  Constitutional: He appears well-developed and well-nourished.  Cardiovascular: Intact distal pulses.  Pulmonary/Chest: Effort normal.  Musculoskeletal: Normal range of motion.  Neurological: He is alert.  Skin: Skin is warm.  Nursing note and vitals reviewed.   Neurovascular status found to be intact muscle strength is adequate range of motion within normal limits with patient found to have incurvated lateral border left hallux is painful when pressed make shoe gear difficult with no redness or active drainage noted     Assessment:  Ingrown toenail deformity left hallux lateral border with pain     Plan:  H&P condition reviewed and recommended correction of the ingrown toenail.  Explained procedure risk and patient wants surgery and have this corrected and at this time I went ahead and I infiltrated the left hallux 60 g like Marcaine mixture sterile prep applied to the toe and using sterile instrumentation remove the lateral border exposed matrix and applied phenol 3 applications 30 seconds followed by alcohol lavage sterile dressing.  Gave instructions on soaks the dressing on until tomorrow unless it were to start to get throbbing and then removed immediately and encouraged to call with questions

## 2018-02-13 ENCOUNTER — Ambulatory Visit: Payer: BC Managed Care – PPO | Admitting: Cardiology

## 2018-02-13 ENCOUNTER — Encounter (INDEPENDENT_AMBULATORY_CARE_PROVIDER_SITE_OTHER): Payer: Self-pay

## 2018-02-13 VITALS — BP 116/50 | HR 66 | Ht 64.0 in | Wt 154.0 lb

## 2018-02-13 DIAGNOSIS — E785 Hyperlipidemia, unspecified: Secondary | ICD-10-CM

## 2018-02-13 DIAGNOSIS — I2510 Atherosclerotic heart disease of native coronary artery without angina pectoris: Secondary | ICD-10-CM

## 2018-02-13 DIAGNOSIS — I10 Essential (primary) hypertension: Secondary | ICD-10-CM | POA: Diagnosis not present

## 2018-02-13 DIAGNOSIS — Z951 Presence of aortocoronary bypass graft: Secondary | ICD-10-CM | POA: Diagnosis not present

## 2018-02-13 DIAGNOSIS — I2109 ST elevation (STEMI) myocardial infarction involving other coronary artery of anterior wall: Secondary | ICD-10-CM | POA: Diagnosis not present

## 2018-02-13 NOTE — Patient Instructions (Signed)
Medication Instructions:  NOT NEEDED If you need a refill on your cardiac medications before your next appointment, please call your pharmacy.   Lab work: NOT NEEDED If you have labs (blood work) drawn today and your tests are completely normal, you will receive your results only by: Marland Kitchen MyChart Message (if you have MyChart) OR . A paper copy in the mail If you have any lab test that is abnormal or we need to change your treatment, we will call you to review the results.  Testing/Procedures: SCHEDULE AT [address] Your physician has requested that you have an exercise tolerance test. For further information please visit [email or web address] Please also follow instruction sheet, as given.    Follow-Up: At [facility], you and your health needs are our priority.  As part of our continuing mission to provide you with exceptional heart care, we have created designated Provider Care Teams.  These Care Teams include your primary Cardiologist (physician) and Advanced Practice Providers (APPs -  Physician Assistants and Nurse Practitioners) who all work together to provide you with the care you need, when you need it. You will need a follow up appointment in 12 months FEB 2021.  Please call our office 2 months in advance to schedule this appointment.  You may see Bryan Lemma, MD or one of the following Advanced Practice Providers on your designated Care Team:   Theodore Demark, PA-C . Joni Reining, DNP, ANP  Any Other Special Instructions Will Be Listed Below (If Applicable).

## 2018-02-13 NOTE — Progress Notes (Signed)
PCP: Georgianne Fickamachandran, Ajith, MD  Clinic Note: Chief Complaint  Patient presents with  . Follow-up    No complaints  . Coronary Artery Disease    s/p CABG. (known prior Anterior MI)    HPI: Levi Obrien is a 73 y.o. male with a PMH below who presents today for 1 year follow-up of his history of CAD-CABG.    Last Myoview March 2015 showed mild LAD distribution infarction consistent with known. No ischemia. This is probably suggestive of flush occluded LAD with distal bypass patent versus occluded diagonal..  Levi Levi Obrien was last seen in late January 2019-as usual, he was doing very well from a cardiac standpoint.  No angina or heart failure symptoms.    Recent Hospitalizations: n/a  Studies Reviewed: n/a  Interval History: [name] returns today again in good spirits doing very well overall.  He says he is very happy because his diabetes control is gotten much better.  He says he has had maybe 1 or 2 times over the last few weeks had some fleeting episodes of discomfort in his chest but this was less than a minute and not associated with exertion. 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  He does get a lot of exercise walking with this job and stays active otherwise walking and doing other exercise.  He denies any rest exertional chest tightness or pressure.  No dyspnea, unless he overexerts. No PND, orthopnea or edema.  No rapid irregular heartbeats palpitations.  No syncope/near syncope or TIA/amaurosis fugax.  No claudication.  ROS: A comprehensive was performed. Review of Systems  Constitutional: Negative for malaise/fatigue.  HENT: Negative for congestion, hearing loss and nosebleeds.   Respiratory: Negative for cough, shortness of breath and wheezing.   Cardiovascular: Negative.        Per history of present illness  Gastrointestinal: Negative for blood in stool and melena.    Genitourinary: Negative for hematuria.  Musculoskeletal: Negative for falls, joint pain and myalgias.  Neurological: Negative for dizziness (No further episodes as his glycemic control has been improved.), focal weakness and loss of consciousness.  Psychiatric/Behavioral: Negative.   All other systems reviewed and are negative.   Past Medical History:  Diagnosis Date  . Acute MI, anterior wall, subsequent episode of care (HCC)  1997   Failed PCI-LAD --> referred for CABG  . CAD in native artery  2002   Cardiac cath: Ostial LAD 100%; R. I 99% proximal, Prox Cx  99%; 90-95% mid RCA.  Patent grafts  . Diabetes mellitus type 2, controlled (HCC)   . Glaucoma   . Hyperlipidemia LDL goal < 70     on statin  . 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 S/P CABG x 5 1997   LIMA-LAD, RIMA-RCA, SVG-RI-D1, SVG-OM  . Seizure disorder, secondary [facility](HCC)     Past Surgical History:  Procedure Laterality Date  . CARDIAC CATHETERIZATION  03/02/2000   LAD 100% occlusion: RI and circumflex--proximal 90%; mid RCA 90 a 95%. Patent LIMA-LAD, RIMA-RCA, SVG to OM, SVG-RI-D1  . CARDIAC CATHETERIZATION  11/27/1995   Totally occluded LAD stented with a 3x15 Multi-Link stent, deployed at 8atm for 52sec. 95% focal eccentric stenosis of the RCA. Considered CABG versus angioplasty of the RCA lesion.  . CORONARY ARTERY BYPASS GRAFT   1997   LIMA-LAD, RIMA-RCA, SVG-RI a-D1, SVG-OM  . NM MYOVIEW  LTD  2010   LOW RISK. No ischemia or infarct.  Marland Kitchen NM MYOVIEW LTD  03/29/2013   LOW RISK: No ischemia. Limited mid anterior wall scar sparing septum and apex; suggests occluded LAD with patent distal bypass versus diagonal disease --> consistent with known CAD  . TRANSTHORACIC ECHOCARDIOGRAM   August 2012   Mildly dilated LV; EF 45-50%. Mild apical HK and mild septal HK with impaired relaxation (grade 1 diastolic function) mildly reduced RV function.    Current Outpatient Medications:   .  atorvastatin (LIPITOR) 40 MG tablet, TAKE 1 TABLET AT 6PM., Disp: 30 tablet, Rfl: 11 .  Dapagliflozin-Metformin HCl ER (XIGDUO XR) 05-998 MG TB24, Take 2 tablets by mouth daily., Disp: , Rfl:  .  insulin aspart (NOVOLOG) 100 UNIT/ML injection, Inject 10 Units into the skin daily after supper., Disp: , Rfl:  .  Insulin Glargine (BASAGLAR KWIKPEN) 100 UNIT/ML SOPN, Inject 43 Units into the skin daily. Pt takes 43 units in the morning, Disp: , Rfl:  .  lisinopril (PRINIVIL,ZESTRIL) 2.5 MG tablet, TAKE 1 TABLET ONCE DAILY., Disp: 90 tablet, Rfl: 3 .  metformin (FORTAMET) 1000 MG (OSM) 24 hr tablet, Take 1,000 mg by mouth 2 (two) times daily., Disp: , Rfl:  No Known Allergies  Social History   Tobacco Use  . Smoking status: Never Smoker  . Smokeless tobacco: Never Used  Substance Use Topics  . Alcohol use: No    Alcohol/week: 0.0 standard drinks  . Drug use: No   Social History   Social History Narrative   He is a married father of 2. He walks daily and gets plan exercise. Never smoked and does not drink alcohol.    Family History  Problem Relation Age of Onset  . Stroke Mother   . Heart disease Mother   . Heart failure Father   . Diabetes Father   . Heart failure Maternal Grandmother   . Heart attack Maternal Grandfather   . Diabetes Paternal Grandmother   . Diabetes Paternal Grandfather     Wt Readings from Last 3 Encounters:  02/13/18 154 lb (69.9 kg)  02/07/17 155 lb 6.4 oz (70.5 kg)  11/21/15 154 lb 12.8 oz (70.2 kg)    PHYSICAL EXAM BP (!) 116/50   Pulse 66   Ht 5\' 4"  (1.626 m)   Wt 154 lb (69.9 kg)   BMI 26.43 kg/m   Physical Exam  Constitutional: He is oriented to person, place, and time. He appears well-developed and well-nourished. No distress.  Healthy-appearing.  Well-groomed  HENT:  Head: Normocephalic and atraumatic.  Mouth/Throat: No oropharyngeal exudate.  Eyes: Conjunctivae and EOM are normal.  Neck: No hepatojugular reflux and no JVD present.  Carotid bruit is not present.  Cardiovascular: Normal rate, regular rhythm, normal heart sounds and normal pulses.  No extrasystoles are present. PMI is not displaced. Exam reveals no gallop and no friction rub.  No murmur heard. Pulmonary/Chest: Effort normal and breath sounds normal. No respiratory distress. He has no wheezes. He has no rales.  Abdominal: Soft. Bowel sounds are normal. He exhibits no distension. There is no abdominal tenderness. There is no rebound.  Musculoskeletal: Normal range of motion.        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 No edema.  Neurological: He is alert and oriented to person, place, and time.  Skin: Skin is warm and dry.  Psychiatric: He has a normal mood and affect. His behavior is normal. Judgment  and thought content normal.  Nursing note and vitals reviewed.    Adult ECG Report  Rate: 60 ;  Rhythm: normal sinus rhythm and Left axis deviation (-42). Nonspecific ST and T-wave changes.  Cannot exclude septal MI, age undetermined.  Otherwise normal intervals, durations and voltage. Borderline septal Q waves, there are small R waves.;  Narrative Interpretation: Stable EKG  Other studies Reviewed: Additional studies/ records that were reviewed today include:  Recent Labs:   No results found for: CHOL, HDL, LDLCALC, LDLDIRECT, TRIG, CHOLHDL  PCP checked in Oct 2019 -not available.  Last labs available from April 04, 2016 shows total Leschber 126, HDL 40, LDL 62, triglycerides 119.  A1c 6.7.  Creatinine 0.9.  ASSESSMENT / PLAN: Problem List Items Addressed This Visit    CAD in native artery; occluded LAD, subtotal occlusion of ostial RI, proximal Cx, mid RCA (Chronic)    Distant history of CABG with no further episodes since. Negative Myoview as far as ischemia back in 2015 with not unexpected the prior infarct noted. Remains on aspirin, atorvastatin and low-dose ACE inhibitor.  We discussed  surveillance evaluation for any potential ischemia given his distant CABG. Plan: GXT to evaluate for symptomatic CAD.      Relevant Orders   EKG 12-Lead (Completed)   EXERCISE TOLERANCE TEST (ETT)   Essential hypertension (Chronic)    Stable blood pressure on low-dose ACE inhibitor.      History of MI, anterior wall => 1997. Failed LAD PCI, treated with CABG (Chronic)    History of a large anterior MI and referred for urgent CABG after failed PCI to the LAD.  This infarct is noted on his Myoview, but no evidence of ischemia. No overt angina or heart failure symptoms with preserved EF.      Relevant Orders   EKG 12-Lead (Completed)   EXERCISE TOLERANCE TEST (ETT)   Hyperlipidemia LDL goal <70 - Primary (Chronic)    Labs are followed by PCP, but was pretty well close to goal last March-is due to be checked this March (was checked in October).  Remains on atorvastatin.  No myalgias.  Continue current doses.      S/P CABG x 5 (Chronic)    Almost 71 year old grafts.  We talked about whether or not we do a surveillance Myoview or just simply a GXT test.  He would prefer to try simply do is doing a GXT to evaluate for symptoms.      Relevant Orders   EKG 12-Lead (Completed)   EXERCISE TOLERANCE TEST (ETT)      Current medicines are reviewed at length with the patient today. (+/- concerns) None The following changes have been made: None  Patient Instructions  Medication Instructions:  NOT NEEDED If you need a refill on your cardiac medications before your next appointment, please call your pharmacy.   Lab work: NOT NEEDED If you have labs (blood work) drawn today and your tests are completely normal, you will receive your results only by: Marland Kitchen MyChart Message (if you have MyChart) OR . A paper copy in the mail If you have any lab test that is abnormal or we need to change your treatment, we will call you to review the results.  Testing/Procedures: SCHEDULE AT [address] Your physician has requested that you have an exercise tolerance test. For further information please visit [email or web address] Please also follow instruction sheet, as given.    Follow-Up: At [facility], you and your health needs are  our priority.  As part of our continuing mission to provide you with exceptional heart care, we have created designated Provider Care Teams.  These Care Teams include your primary Cardiologist (physician) and Advanced Practice Providers (APPs -  Physician Assistants and Nurse Practitioners) who all work together to provide you with the care you need, when you need it. You will need a follow up appointment in 12 months FEB 2021.  Please call our office 2 months in advance to schedule this appointment.  You may see Bryan Lemma, MD or one of the following Advanced Practice Providers on your designated Care Team:   Theodore Demark, PA-C . Joni Reining, DNP, ANP  Any Other Special Instructions Will Be Listed Below (If Applicable).    Studies Ordered:   Orders Placed This Encounter  Procedures  . EXERCISE TOLERANCE TEST (ETT)  . EKG 12-Lead      Bryan Lemma, M.D., M.S. Interventional Cardiologist   Pager # [phone] Phone # [phone] [address] [place], [place] [address]

## 2018-02-14 ENCOUNTER — Telehealth: Payer: Self-pay | Admitting: Cardiology

## 2018-02-14 NOTE — Telephone Encounter (Signed)
Left message for patient to call and schedule GXT that was ordered by Dr. Herbie BaltimoreHarding

## 2018-02-15 ENCOUNTER — Encounter: Payer: Self-pay | Admitting: Cardiology

## 2018-02-15 NOTE — Assessment & Plan Note (Signed)
Almost 71 year old grafts.  We talked about whether or not we do a surveillance Myoview or just simply a GXT test.  He would prefer to try simply do is doing a GXT to evaluate for symptoms.

## 2018-02-15 NOTE — Assessment & Plan Note (Signed)
History of a large anterior MI and referred for urgent CABG after failed PCI to the LAD.  This infarct is noted on his Myoview, but no evidence of ischemia. No overt angina or heart failure symptoms with preserved EF.

## 2018-02-15 NOTE — Assessment & Plan Note (Signed)
Labs are followed by PCP, but was pretty well close to goal last March-is due to be checked this March (was checked in October).  Remains on atorvastatin.  No myalgias.  Continue current doses.

## 2018-02-15 NOTE — Assessment & Plan Note (Signed)
Stable blood pressure on low-dose ACE inhibitor.

## 2018-02-15 NOTE — Assessment & Plan Note (Signed)
Distant history of CABG with no further episodes since. Negative Myoview as far as ischemia back in 2015 with not unexpected the prior infarct noted. Remains on aspirin, atorvastatin and low-dose ACE inhibitor.  We discussed surveillance evaluation for any potential ischemia given his distant CABG. Plan: GXT to evaluate for symptomatic CAD.

## 2018-02-21 ENCOUNTER — Telehealth (HOSPITAL_COMMUNITY): Payer: Self-pay

## 2018-02-21 NOTE — Telephone Encounter (Signed)
Encounter complete. 

## 2018-02-24 ENCOUNTER — Ambulatory Visit (HOSPITAL_COMMUNITY)
Admission: RE | Admit: 2018-02-24 | Discharge: 2018-02-24 | Disposition: A | Discharge status: Home / Self Care | Payer: BC Managed Care – PPO | Source: Ambulatory Visit | Attending: Cardiology | Admitting: Cardiology

## 2018-02-24 ENCOUNTER — Other Ambulatory Visit: Payer: Self-pay | Admitting: Cardiology

## 2018-02-24 DIAGNOSIS — Z951 Presence of aortocoronary bypass graft: Secondary | ICD-10-CM | POA: Insufficient documentation

## 2018-02-24 DIAGNOSIS — I2109 ST elevation (STEMI) myocardial infarction involving other coronary artery of anterior wall: Secondary | ICD-10-CM | POA: Diagnosis present

## 2018-02-24 DIAGNOSIS — I2510 Atherosclerotic heart disease of native coronary artery without angina pectoris: Secondary | ICD-10-CM | POA: Diagnosis present

## 2018-02-24 LAB — EXERCISE TOLERANCE TEST
CHL CUP MPHR: 150 {beats}/min
CHL CUP RESTING HR STRESS: 56 {beats}/min
CHL RATE OF PERCEIVED EXERTION: 17
CSEPEDS: 0 s
CSEPHR: 90 %
CSEPPHR: 136 {beats}/min
Estimated workload: 7 METS
Exercise duration (min): 6 min

## 2018-03-09 ENCOUNTER — Telehealth: Payer: Self-pay | Admitting: Cardiology

## 2018-03-09 NOTE — Telephone Encounter (Signed)
w Up:     Returning your call from yesterday, concerning his Stress Test results.

## 2018-03-09 NOTE — Telephone Encounter (Signed)
  Patient returning your call, He states you can leave a message with his wife.

## 2018-03-10 ENCOUNTER — Telehealth: Payer: Self-pay | Admitting: Cardiology

## 2018-03-10 NOTE — Telephone Encounter (Signed)
Per pt - stated returning this office call for his results and gave verbal permission to leave voice mail of it if we do not reach him.

## 2018-03-10 NOTE — Telephone Encounter (Signed)
Called patient, LVM advising of results. Okay to leave message per patient. Left call back number if questions.

## 2018-05-06 ENCOUNTER — Other Ambulatory Visit: Payer: Self-pay | Admitting: Cardiology

## 2018-05-08 NOTE — Telephone Encounter (Signed)
Atorvastatin refilled.  

## 2018-07-29 ENCOUNTER — Other Ambulatory Visit: Payer: Self-pay | Admitting: Cardiology

## 2018-08-24 ENCOUNTER — Other Ambulatory Visit: Payer: Self-pay | Admitting: Cardiology

## 2018-09-28 ENCOUNTER — Other Ambulatory Visit: Payer: Self-pay | Admitting: Cardiology

## 2019-02-21 ENCOUNTER — Ambulatory Visit: Payer: BC Managed Care – PPO | Admitting: Cardiology

## 2019-03-09 ENCOUNTER — Other Ambulatory Visit: Payer: Self-pay | Admitting: Cardiology

## 2019-04-06 ENCOUNTER — Encounter (INDEPENDENT_AMBULATORY_CARE_PROVIDER_SITE_OTHER): Payer: Self-pay

## 2019-04-06 ENCOUNTER — Ambulatory Visit: Payer: BC Managed Care – PPO | Admitting: Cardiology

## 2019-04-06 ENCOUNTER — Encounter: Payer: Self-pay | Admitting: Cardiology

## 2019-04-06 ENCOUNTER — Other Ambulatory Visit: Payer: Self-pay

## 2019-04-06 VITALS — BP 115/70 | HR 57 | Temp 96.7°F | Ht 64.0 in | Wt 149.6 lb

## 2019-04-06 DIAGNOSIS — E785 Hyperlipidemia, unspecified: Secondary | ICD-10-CM | POA: Diagnosis not present

## 2019-04-06 DIAGNOSIS — I2109 ST elevation (STEMI) myocardial infarction involving other coronary artery of anterior wall: Secondary | ICD-10-CM

## 2019-04-06 DIAGNOSIS — I2510 Atherosclerotic heart disease of native coronary artery without angina pectoris: Secondary | ICD-10-CM | POA: Diagnosis not present

## 2019-04-06 DIAGNOSIS — Z951 Presence of aortocoronary bypass graft: Secondary | ICD-10-CM

## 2019-04-06 DIAGNOSIS — I10 Essential (primary) hypertension: Secondary | ICD-10-CM

## 2019-04-06 NOTE — Progress Notes (Signed)
Primary Care Provider: Georgianne Fick, MD Cardiologist: Bryan Lemma, MD Electrophysiologist: None  Clinic Note: Chief Complaint  Patient presents with  . Follow-up    Annual.  No change in symptoms.  . Coronary Artery Disease    No angina   HPI:    Levi Obrien is a 74 y.o. male with a history of CAD-CABG who presents today for annual follow-up.  CAD History  1997 Large Anterior STEMI: BMS PCI ostial LAD, also noted 95% focal RCA stenosis. ->  Referred for CABG x5  1997 CABG x5: LIMA-LAD, RIMA-RCA, SVG-RI a-D1, SVG-OM  2002 -Cath: Flush occlusion of LAD, RI and LCx prox 99% and mid RCA 95%; Patent Grafts  Moview 2010 -no ischemia or infarction  Myoview 2015: Limited mid anterior wall scarring with sparing of the apex consistent with known flush occlusion of proximal LAD and prior LAD infarct.  Echo: EF 45-50%, mild Apical & Septal HK, Gr 1 DD.   Levi Obrien was last seen in February 2020 (prior to COVID-19 lockdown).    GXT/ETT ordered to assess for symptomatic disease.  Reviewed below.  No symptoms.  Recent Hospitalizations: None  Reviewed  CV studies:    The following studies were reviewed today: (if available, images/films reviewed: From Epic Chart or Care Everywhere) . GXT/ETT 02/24/2018: Exercised 6 minutes.  No angina.  Uninterpretable EKG with PVCs in recovery.  No suggestion of ischemia S -T wave changes.  Interval History:   Levi Obrien returns here today for annual follow-up doing quite well from a cardiac standpoint.  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  This actually keeps him very active, he does a lot of walking between home sites.  Working gives him a sense of accomplishment and a relatively steady income that he enjoys. He has not had any cardiac symptoms of chest tightness or pressure to suggest recurrent angina.  No heart failure symptoms or arrhythmia symptoms.  CV Review of  Symptoms (Summary): no chest pain or dyspnea on exertion negative for - edema, irregular heartbeat, loss of consciousness, orthopnea, palpitations, paroxysmal nocturnal dyspnea, rapid heart rate, shortness of breath or Near syncope.  TIA/amaurosis fugax.  Claudication.  The patient does not have symptoms concerning for COVID-19 infection (fever, chills, cough, or new shortness of breath).  The patient is practicing social distancing & Masking.   He had his first COVID-19 vaccine injection on March 1 and is due to have his second dose later on this week.  REVIEWED OF SYSTEMS   Review of Systems  Constitutional: Negative for malaise/fatigue and weight loss.  HENT: Negative for congestion and nosebleeds.   Eyes:       Currently doing eyedrops for left eye glaucoma  Respiratory: Negative for shortness of breath and wheezing.   Gastrointestinal: Negative for blood in stool, constipation and melena.  Genitourinary: Negative for hematuria.  Musculoskeletal: Negative for falls and joint pain.  Neurological: Negative for dizziness, focal weakness, weakness and headaches.  Endo/Heme/Allergies: Negative for environmental allergies.  Psychiatric/Behavioral: Negative for memory loss. The patient is not nervous/anxious and does not have insomnia.    I have reviewed and (if needed) personally updated the patient's problem list, medications, allergies, past medical and surgical history, social and family history.   PAST MEDICAL HISTORY   Past Medical History:  Diagnosis Date  . Acute MI, anterior wall, subsequent episode of care [facility]) 1997   Failed PCI-LAD --> referred for CABG  x 5 (flush occluded LAD, along with severe disease in RCA, RI and LCx)  . CAD in native artery 2002   Cardiac cath: Ostial LAD 100%; R. I 99% proximal, Prox Cx  99%; 90-95% mid RCA.  Patent grafts  . Diabetes mellitus type 2, controlled (HCC)   . Glaucoma   . Hyperlipidemia LDL goal < 70     on statin  . 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 S/P CABG x 5 1997   LIMA-LAD, RIMA-RCA, SVG-RI-D1, SVG-OM  . Seizure disorder, secondary (HCC)     PAST SURGICAL HISTORY   Reviewed in Epic - pertinent studies summarized above.   MEDICATIONS/ALLERGIES   Current Meds  Medication Sig  . atorvastatin (LIPITOR) 40 MG tablet TAKE 1 TABLET AT 6PM.  . Dapagliflozin-Metformin HCl ER (XIGDUO XR) 05-998 MG TB24 Take 2 tablets by mouth daily.  . insulin aspart (NOVOLOG) 100 UNIT/ML injection Inject 10 Units into the skin daily after supper.  . Insulin Glargine (BASAGLAR KWIKPEN) 100 UNIT/ML SOPN Inject 43 Units into the skin daily. Pt takes 43 units in the morning  . lisinopril (ZESTRIL) 2.5 MG tablet TAKE 1 TABLET ONCE DAILY.  Marland Kitchen metformin (FORTAMET) 1000 MG (OSM) 24 hr tablet Take 1,000 mg by mouth 2 (two) times daily.    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  OBJCTIVE -PE, EKG, labs   Wt Readings from Last 3 Encounters:  04/06/19 149 lb 9.6 oz (67.9 kg)  02/13/18 154 lb (69.9 kg)  02/07/17 155 lb 6.4 oz (70.5 kg)    Physical Exam: BP 115/70   Pulse (!) 57   Temp (!) 96.7 F (35.9 C)   Ht 5\' 4"  (1.626 m)   Wt 149 lb 9.6 oz (67.9 kg)   SpO2 96%   BMI 25.68 kg/m  Physical Exam  Constitutional: He is oriented to person, place, and time. He appears well-developed and well-nourished. No distress.  Healthy-appearing gentleman.  Appears younger than stated age.  Well-groomed  HENT:  Head: Normocephalic and atraumatic.  Neck: No hepatojugular reflux and no JVD present. Carotid bruit is not present.  Cardiovascular: Normal rate, regular rhythm, normal heart sounds and intact distal pulses.  No extrasystoles are present. PMI is not displaced. Exam reveals no gallop and no friction rub.  No murmur heard. Pulmonary/Chest: Effort normal  and breath sounds normal. No respiratory distress. He has no wheezes. He has no rales.  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 No edema (Trivial). Normal range of motion.     Cervical back: Normal range of motion and neck supple.  Neurological: He is alert and oriented to person, place, and time.  Skin: He is not diaphoretic.  Psychiatric: He has a normal mood and affect. His behavior is normal. Judgment and thought content normal.  Vitals reviewed.    Adult ECG Report  Rate: 57 ;  Rhythm: sinus bradycardia and Borderline IVCD.  ST-T wave changes, consider inferior lateral ischemia.  Otherwise normal axis, intervals and durations.;   Narrative Interpretation: Stable EKG.  Recent Labs: Due to have labs checked by PCP in April -> clinic note sent from PCP, but did not include actual lab results.  No results found for: CHOL, HDL, LDLCALC, LDLDIRECT,  TRIG, CHOLHDL Lab Results  Component Value Date   CREATININE 0.90 06/14/2012   BUN 21 06/14/2012   NA 143 06/14/2012   K 3.8 06/14/2012   CL 109 06/14/2012   CO2 21 06/14/2012   No results found for: TSH  ASSESSMENT/PLAN    Problem List Items Addressed This Visit    CAD in native artery; occluded LAD, subtotal occlusion of ostial RI, proximal Cx, mid RCA (Chronic)    Distant history of CABG in 1997 in the setting of anterior STEMI with unsuccessful LAD PCI.  Follow-up cath showed flush occlusion of the LAD with severe native disease of RCA, RI and LCx.  All grafts patent. Myoview in 2015 suggested prior anterior infarct which is consistent with known CAD findings. GXT last year difficult to interpret reportedly because of EKG abnormality.  However he had no chest pain after 6 minutes of exertion. No recurrent anginal pain.  Plan:  Continue atorvastatin and lisinopril.  He is on combination of Metformin and dapagliflozin  Not on beta-blocker because  history of fatigue and bradycardia.  Had been on aspirin, but this was stopped because of bruising.      Relevant Orders   EKG 12-Lead (Completed)   History of MI, anterior wall => 1997. Failed LAD PCI, treated with CABG (Chronic)    History of large anterior MI referred for urgent CABG after failed LAD PCI.  Myoview and echocardiogram both show evidence of LAD distribution wall motion normality/infarct.  However EF is relatively well-preserved 45 to 50% with no heart failure symptoms. He has had no angina or heart failure symptoms in the last 10 years.      Essential hypertension (Chronic)    Blood pressure looks great on low-dose lisinopril.   He probably actually does not have hypertension.  ACE inhibitor is being used for ischemic cardiomyopathy.      S/P CABG x 5 (Chronic)    Evaluated for ischemic symptoms GXT last year.  Unfortunately, was not a very interpretable study.  However, he did walk 6 minutes with no chest pain and no obvious EKG changes.  This would argue against ongoing ischemia.  Would probably consider reevaluation in 3 to 4years, but otherwise would not reassess in the absence of symptoms.      Relevant Orders   EKG 12-Lead (Completed)   Hyperlipidemia LDL goal <70 - Primary (Chronic)    He should be due for lab follow-up with PCP next month.  Has been relatively stable.  Tolerating atorvastatin without difficulty.  We will try to get results from PCPs office.         COVID-19 Education: The signs and symptoms of COVID-19 were discussed with the patient and how to seek care for testing (follow up with PCP or arrange E-visit).   The importance of social distancing was discussed today.  I spent a total of 30minutes with the patient. >  50% of the time was spent in direct patient consultation.  Additional time spent with chart review  / charting (studies, outside notes, etc): 8 Total Time: 29 min   Current medicines are reviewed at length with the patient  today.  (+/- concerns) none  Notice: This dictation was prepared with Dragon dictation along with smaller phrase technology. Any transcriptional errors that result from this process are unintentional and may not be corrected upon review.  Patient Instructions / Medication Changes & Studies & Tests Ordered   Patient Instructions  Medication Instructions:  No changes  *If you  need a refill on your cardiac medications before your next appointment, please call your pharmacy*   Lab Work: Please have your primary send a copy of labs from upcoming  April appointment   Testing/Procedures: Not needed   Follow-Up: At [facility], you and your health needs are our priority.  As part of our continuing mission to provide you with exceptional heart care, we have created designated Provider Care Teams.  These Care Teams include your primary Cardiologist (physician) and Advanced Practice Providers (APPs -  Physician Assistants and Nurse Practitioners) who all work together to provide you with the care you need, when you need it.  We recommend signing up for the patient portal called "MyChart".  Sign up information is provided on this After Visit Summary.  MyChart is used to connect with patients for Virtual Visits (Telemedicine).  Patients are able to view lab/test results, encounter notes, upcoming appointments, etc.  Non-urgent messages can be sent to your provider as well.   To learn more about what you can do with MyChart, go to [email or web address].    Your next appointment:   12 month(s)  The format for your next appointment:   In Person  Provider:   Bryan Lemma, MD    Studies Ordered:   Orders Placed This Encounter  Procedures  . EKG 12-Lead      Bryan Lemma, M.D., M.S. Interventional Cardiologist   Pager # [phone] Phone # [phone] [address] [place], [place] [address]   Thank you for choosing Heartcare at [facility]!!

## 2019-04-06 NOTE — Patient Instructions (Signed)
Medication Instructions:  No changes  *If you need a refill on your cardiac medications before your next appointment, please call your pharmacy*   Lab Work: Please have your primary send a copy of labs from upcoming  April appointment   Testing/Procedures: Not needed   Follow-Up: At [facility], you and your health needs are our priority.  As part of our continuing mission to provide you with exceptional heart care, we have created designated Provider Care Teams.  These Care Teams include your primary Cardiologist (physician) and Advanced Practice Providers (APPs -  Physician Assistants and Nurse Practitioners) who all work together to provide you with the care you need, when you need it.  We recommend signing up for the patient portal called "MyChart".  Sign up information is provided on this After Visit Summary.  MyChart is used to connect with patients for Virtual Visits (Telemedicine).  Patients are able to view lab/test results, encounter notes, upcoming appointments, etc.  Non-urgent messages can be sent to your provider as well.   To learn more about what you can do with MyChart, go to [email or web address].    Your next appointment:   12 month(s)  The format for your next appointment:   In Person  Provider:   Bryan Lemma, MD

## 2019-04-12 ENCOUNTER — Encounter: Payer: Self-pay | Admitting: Cardiology

## 2019-04-12 NOTE — Assessment & Plan Note (Signed)
Blood pressure looks great on low-dose lisinopril.   He probably actually does not have hypertension.  ACE inhibitor is being used for ischemic cardiomyopathy.

## 2019-04-12 NOTE — Assessment & Plan Note (Addendum)
History of large anterior MI referred for urgent CABG after failed LAD PCI.  Myoview and echocardiogram both show evidence of LAD distribution wall motion normality/infarct.  However EF is relatively well-preserved 45 to 50% with no heart failure symptoms. He has had no angina or heart failure symptoms in the last 10 years.

## 2019-04-12 NOTE — Assessment & Plan Note (Addendum)
Distant history of CABG in 1997 in the setting of anterior STEMI with unsuccessful LAD PCI.  Follow-up cath showed flush occlusion of the LAD with severe native disease of RCA, RI and LCx.  All grafts patent. Myoview in 2015 suggested prior anterior infarct which is consistent with known CAD findings. GXT last year difficult to interpret reportedly because of EKG abnormality.  However he had no chest pain after 6 minutes of exertion. No recurrent anginal pain.  Plan:  Continue atorvastatin and lisinopril.  He is on combination of Metformin and dapagliflozin  Not on beta-blocker because history of fatigue and bradycardia.  Had been on aspirin, but this was stopped because of bruising.

## 2019-04-12 NOTE — Assessment & Plan Note (Signed)
Evaluated for ischemic symptoms GXT last year.  Unfortunately, was not a very interpretable study.  However, he did walk 6 minutes with no chest pain and no obvious EKG changes.  This would argue against ongoing ischemia.  Would probably consider reevaluation in 3 to 4years, but otherwise would not reassess in the absence of symptoms.

## 2019-04-12 NOTE — Assessment & Plan Note (Signed)
He should be due for lab follow-up with PCP next month.  Has been relatively stable.  Tolerating atorvastatin without difficulty.  We will try to get results from PCPs office.

## 2019-06-17 ENCOUNTER — Other Ambulatory Visit: Payer: Self-pay | Admitting: Cardiology

## 2019-06-18 NOTE — Telephone Encounter (Signed)
Rx request sent to pharmacy.  

## 2019-07-19 ENCOUNTER — Emergency Department (HOSPITAL_COMMUNITY): Payer: BC Managed Care – PPO

## 2019-07-19 ENCOUNTER — Emergency Department (HOSPITAL_COMMUNITY)
Admission: EM | Admit: 2019-07-19 | Discharge: 2019-07-19 | Disposition: A | Discharge status: Home / Self Care | Payer: BC Managed Care – PPO | Attending: Emergency Medicine | Admitting: Emergency Medicine

## 2019-07-19 DIAGNOSIS — I2510 Atherosclerotic heart disease of native coronary artery without angina pectoris: Secondary | ICD-10-CM | POA: Diagnosis not present

## 2019-07-19 DIAGNOSIS — E119 Type 2 diabetes mellitus without complications: Secondary | ICD-10-CM | POA: Insufficient documentation

## 2019-07-19 DIAGNOSIS — Z79899 Other long term (current) drug therapy: Secondary | ICD-10-CM | POA: Diagnosis not present

## 2019-07-19 DIAGNOSIS — H81399 Other peripheral vertigo, unspecified ear: Secondary | ICD-10-CM

## 2019-07-19 DIAGNOSIS — Z951 Presence of aortocoronary bypass graft: Secondary | ICD-10-CM | POA: Diagnosis not present

## 2019-07-19 DIAGNOSIS — R42 Dizziness and giddiness: Secondary | ICD-10-CM | POA: Diagnosis present

## 2019-07-19 DIAGNOSIS — I10 Essential (primary) hypertension: Secondary | ICD-10-CM | POA: Insufficient documentation

## 2019-07-19 DIAGNOSIS — R531 Weakness: Secondary | ICD-10-CM | POA: Insufficient documentation

## 2019-07-19 DIAGNOSIS — Z794 Long term (current) use of insulin: Secondary | ICD-10-CM | POA: Diagnosis not present

## 2019-07-19 DIAGNOSIS — R200 Anesthesia of skin: Secondary | ICD-10-CM | POA: Insufficient documentation

## 2019-07-19 LAB — DIFFERENTIAL
Abs Immature Granulocytes: 0.03 10*3/uL (ref 0.00–0.07)
Basophils Absolute: 0.1 10*3/uL (ref 0.0–0.1)
Basophils Relative: 1 %
Eosinophils Absolute: 0.1 10*3/uL (ref 0.0–0.5)
Eosinophils Relative: 1 %
Immature Granulocytes: 0 %
Lymphocytes Relative: 22 %
Lymphs Abs: 1.6 10*3/uL (ref 0.7–4.0)
Monocytes Absolute: 0.7 10*3/uL (ref 0.1–1.0)
Monocytes Relative: 9 %
Neutro Abs: 4.8 10*3/uL (ref 1.7–7.7)
Neutrophils Relative %: 67 %

## 2019-07-19 LAB — COMPREHENSIVE METABOLIC PANEL
ALT: 21 U/L (ref 0–44)
AST: 19 U/L (ref 15–41)
Albumin: 4.1 g/dL (ref 3.5–5.0)
Alkaline Phosphatase: 86 U/L (ref 38–126)
Anion gap: 11 (ref 5–15)
BUN: 19 mg/dL (ref 8–23)
CO2: 22 mmol/L (ref 22–32)
Calcium: 9.4 mg/dL (ref 8.9–10.3)
Chloride: 104 mmol/L (ref 98–111)
Creatinine, Ser: 0.88 mg/dL (ref 0.61–1.24)
GFR calc Af Amer: >60 mL/min (ref >60)
GFR calc non Af Amer: >60 mL/min (ref >60)
Glucose, Bld: 295 mg/dL — ABNORMAL HIGH (ref 70–99)
Potassium: 4.4 mmol/L (ref 3.5–5.1)
Sodium: 137 mmol/L (ref 135–145)
Total Bilirubin: 1.4 mg/dL — ABNORMAL HIGH (ref 0.3–1.2)
Total Protein: 7 g/dL (ref 6.5–8.1)

## 2019-07-19 LAB — CBC
HCT: 50.1 % (ref 39.0–52.0)
Hemoglobin: 16.3 g/dL (ref 13.0–17.0)
MCH: 30.3 pg (ref 26.0–34.0)
MCHC: 32.5 g/dL (ref 30.0–36.0)
MCV: 93.1 fL (ref 80.0–100.0)
Platelets: 195 10*3/uL (ref 150–400)
RBC: 5.38 MIL/uL (ref 4.22–5.81)
RDW: 13.7 % (ref 11.5–15.5)
WBC: 7.3 10*3/uL (ref 4.0–10.5)
nRBC: 0 % (ref 0.0–0.2)

## 2019-07-19 LAB — I-STAT CHEM 8, ED
BUN: 22 mg/dL (ref 8–23)
Calcium, Ion: 1.21 mmol/L (ref 1.15–1.40)
Chloride: 104 mmol/L (ref 98–111)
Creatinine, Ser: 0.8 mg/dL (ref 0.61–1.24)
Glucose, Bld: 299 mg/dL — ABNORMAL HIGH (ref 70–99)
HCT: 51 % (ref 39.0–52.0)
Hemoglobin: 17.3 g/dL — ABNORMAL HIGH (ref 13.0–17.0)
Potassium: 4.3 mmol/L (ref 3.5–5.1)
Sodium: 141 mmol/L (ref 135–145)
TCO2: 23 mmol/L (ref 22–32)

## 2019-07-19 LAB — PROTIME-INR
INR: 1 (ref 0.8–1.2)
Prothrombin Time: 12.3 seconds (ref 11.4–15.2)

## 2019-07-19 LAB — APTT: aPTT: 26 seconds (ref 24–36)

## 2019-07-19 MED ORDER — MECLIZINE HCL 25 MG PO TABS
25.0000 mg | ORAL_TABLET | Freq: Once | ORAL | Status: AC
  Administered 2019-07-19: 25 mg via ORAL
  Filled 2019-07-19: qty 1

## 2019-07-19 MED ORDER — SODIUM CHLORIDE 0.9% FLUSH
3.0000 mL | Freq: Once | INTRAVENOUS | Status: AC
Start: 2019-07-19 — End: 2019-07-19
  Administered 2019-07-19: 3 mL via INTRAVENOUS

## 2019-07-19 NOTE — ED Triage Notes (Signed)
Pt sent over by MD for possible stroke v/s vertigo ., pt had a stroke as a child but is only c/o gen weakness and dizziness now which is better , pt does have a small facial drop but wife states that is his normal

## 2019-07-19 NOTE — ED Provider Notes (Signed)
[facility] EMERGENCY DEPARTMENT Provider Note   CSN: [id number] Arrival date & time: 07/19/19  1519     History No chief complaint on file.   Levi Obrien is a 74 y.o. male.  Patient is a 74 year old male with a history of previous stroke in childhood, CABG x5, presenting with dizziness which initiated this morning.  Patient states that when he woke this morning he felt like "the room was spinning" and had difficulty ambulating for fear that he would fall.  His wife states that he had a somewhat shuffling gait this morning as he was concerned that he may fall.  Patient states that at baseline his right side is weak with some reduced sensation compared to the left and that he has a right-sided facial droop from his stroke as a child.  Patient states that over the day his symptoms resolved and that he currently has no symptoms other than a small headache earlier in the day.       Past Medical History:  Diagnosis Date  . Acute MI, anterior wall, subsequent episode of care (HCC) 1997   Failed PCI-LAD --> referred for CABG x 5 (flush occluded LAD, along with severe disease in RCA, RI and LCx)  . CAD in native artery 2002   Cardiac cath: Ostial LAD 100%; R. I 99% proximal, Prox Cx  99%; 90-95% mid RCA.  Patent grafts  . Diabetes mellitus type 2, controlled (HCC)   . Glaucoma   . Hyperlipidemia LDL goal < 70     on statin  . 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 S/P CABG x 5 1997   LIMA-LAD, RIMA-RCA, SVG-RI-D1, SVG-OM  . Seizure disorder, secondary [facility])     Patient Active Problem List   Diagnosis Date Noted  . Essential hypertension 02/07/2017  . S/P CABG x 5   . Hyperlipidemia LDL goal <70   . CAD in native artery; occluded LAD, subtotal occlusion of ostial RI, proximal Cx, mid RCA 03/31/2000  . History of MI, anterior wall => 1997. Failed LAD PCI, treated with CABG 05/05/1995    Past Surgical History:  Procedure  Laterality Date  . CARDIAC CATHETERIZATION  03/02/2000   LAD 100% occlusion: prox RI & prox LCx -99%; mid RCA 95%. Patent LIMA-LAD, RIMA-RCA, SVG to OM, SVG-RI-D1  . CARDIAC CATHETERIZATION  11/27/1995   Totally occluded LAD stented with a 3x15 Multi-Link stent, deployed at 8atm for 52sec. 95% focal eccentric stenosis of the RCA. Considered CABG versus angioplasty of the RCA lesion.  . CORONARY ARTERY BYPASS GRAFT   1997   LIMA-LAD, RIMA-RCA, SVG-RI a-D1, SVG-OM  . NM MYOVIEW LTD  2010   LOW RISK. No ischemia or infarct.  Marland Kitchen NM MYOVIEW LTD  03/29/2013   LOW RISK: No ischemia. Limited mid anterior wall scar sparing septum and apex; suggests occluded LAD with patent distal bypass versus diagonal disease --> consistent with known CAD  . TRANSTHORACIC ECHOCARDIOGRAM   August 2012   Mildly dilated LV; EF 45-50%. Mild apical HK and mild septal HK with impaired relaxation (grade 1 diastolic function) mildly reduced RV function.       Family History  Problem Relation Age of Onset  . Stroke Mother   . Heart disease Mother   . Heart failure Father   . Diabetes Father   . Heart failure Maternal Grandmother   . Heart attack Maternal Grandfather   . Diabetes Paternal Grandmother   .  Diabetes Paternal Grandfather     Social History   Tobacco Use  . Smoking status: Never Smoker  . Smokeless tobacco: Never Used  Substance Use Topics  . Alcohol use: No    Alcohol/week: 0.0 standard drinks  . Drug use: No    Home Medications Prior to Admission medications   Medication Sig Start Date End Date Taking? Authorizing Provider  atorvastatin (LIPITOR) 40 MG tablet TAKE 1 TABLET AT 6PM. Patient taking differently: Take 40 mg by mouth at bedtime.  09/28/18  Yes Marykay Lex, MD  Dapagliflozin-Metformin HCl ER (XIGDUO XR) 05-998 MG TB24 Take 2 tablets by mouth daily with supper.    Yes [provider]  dorzolamide-timolol (COSOPT) 22.3-6.8 MG/ML ophthalmic solution Place 1 drop into both  eyes in the morning and at bedtime. 04/30/19  Yes [provider]  insulin aspart (NOVOLOG) 100 UNIT/ML injection Inject 10 Units into the skin daily before supper.    Yes [provider]  Insulin Glargine (BASAGLAR KWIKPEN) 100 UNIT/ML SOPN Inject 43 Units into the skin daily after breakfast.    Yes [provider]  lisinopril (ZESTRIL) 2.5 MG tablet TAKE 1 TABLET ONCE DAILY. Patient taking differently: Take 2.5 mg by mouth at bedtime.  06/18/19  Yes Marykay Lex, MD  NITROGLYCERIN PO Place 1 tablet under the tongue every 5 (five) minutes as needed (for chest pain).    Yes [provider]    Allergies    Patient has no known allergies.  Review of Systems   Review of Systems  Constitutional: Negative for fever.  HENT: Positive for rhinorrhea.   Respiratory: Negative for chest tightness and shortness of breath.   Cardiovascular: Negative for chest pain.  Gastrointestinal: Negative for abdominal pain, constipation and diarrhea.  Neurological: Positive for weakness (chronic on left side), numbness (chronic on left side) and headaches.    Physical Exam Updated Vital Signs BP 132/71 (BP Location: Right Arm)   Pulse (!) 54   Temp 98.6 F (37 C) (Oral)   Resp 18   Ht 5\' 4"  (1.626 m)   Wt 68.9 kg   SpO2 99%   BMI 26.09 kg/m   Physical Exam Constitutional:      Appearance: Normal appearance.  HENT:     Head: Normocephalic and atraumatic.     Mouth/Throat:     Mouth: Mucous membranes are moist.  Eyes:     Extraocular Movements: Extraocular movements intact.     Pupils: Pupils are equal, round, and reactive to light.  Cardiovascular:     Rate and Rhythm: Normal rate and regular rhythm.     Heart sounds: Normal heart sounds. No murmur heard.   Pulmonary:     Effort: No respiratory distress.     Breath sounds: Normal breath sounds.  Abdominal:     General: Abdomen is flat. Bowel sounds are normal. There is no distension.     Palpations:  Abdomen is soft.     Tenderness: There is no abdominal tenderness.  Musculoskeletal:        General: Deformity (contracture of left wrist) present.     Cervical back: Normal range of motion.  Skin:    General: Skin is warm and dry.  Neurological:     Mental Status: He is alert. Mental status is at baseline.     Cranial Nerves: No cranial nerve deficit.     Sensory: Sensory deficit (reduced sensation in left arm (pt says is baseline)) present.  Motor: Weakness (4/5 in left upper/lower extremity (pt says is baseline)) present.  Psychiatric:        Mood and Affect: Mood normal.     ED Results / Procedures / Treatments   Labs (all labs ordered are listed, but only abnormal results are displayed) Labs Reviewed  COMPREHENSIVE METABOLIC PANEL - Abnormal; Notable for the following components:      Result Value   Glucose, Bld 295 (*)    Total Bilirubin 1.4 (*)    All other components within normal limits  I-STAT CHEM 8, ED - Abnormal; Notable for the following components:   Glucose, Bld 299 (*)    Hemoglobin 17.3 (*)    All other components within normal limits  PROTIME-INR  APTT  CBC  DIFFERENTIAL    EKG None  Radiology CT HEAD WO CONTRAST  Result Date: 07/19/2019 CLINICAL DATA:  Weakness and dizziness. EXAM: CT HEAD WITHOUT CONTRAST TECHNIQUE: Contiguous axial images were obtained from the base of the skull through the vertex without intravenous contrast. COMPARISON:  None. FINDINGS: Brain: No evidence of acute infarction, hemorrhage, hydrocephalus, extra-axial collection or mass lesion/mass effect. Old left MCA territory infarct with left frontal and temporal lobe encephalomalacia and ex vacuo dilatation of the left lateral ventricle. Right frontal approach stimulator lead terminating in the right hippocampus. Mild generalized cerebral atrophy. Vascular: Calcified atherosclerosis at the skullbase. No hyperdense vessel. Skull: Bifrontal burr holes. Prior left craniotomy. Negative  for fracture or focal lesion. Sinuses/Orbits: No acute finding. Other: None. IMPRESSION: 1. No acute intracranial abnormality. 2. Old left MCA territory infarct. Electronically Signed   By: Obie Dredge M.D.   On: 07/19/2019 18:24   MR BRAIN WO CONTRAST  Result Date: 07/19/2019 CLINICAL DATA:  Ataxia EXAM: MRI HEAD WITHOUT CONTRAST TECHNIQUE: Multiplanar, multiecho pulse sequences of the brain and surrounding structures were obtained without intravenous contrast. COMPARISON:  Head CT 07/19/2019 FINDINGS: Brain: There is encephalomalacia of the left frontal operculum cystic dilatation of the left lateral ventricle. There is a stimulator lead that terminates at the right hippocampus. There is no acute ischemia or intracranial hemorrhage. There is a focus of chronic microhemorrhage in the right occipital lobe. Vascular: Normal flow voids. Skull and upper cervical spine: Old left-sided craniotomy. Right frontal burr hole. Sinuses/Orbits: Negative. Other: None. IMPRESSION: 1. No acute intracranial abnormality. 2. Encephalomalacia of the left frontal operculum with cystic dilatation of the left lateral ventricle. Electronically Signed   By: Deatra Robinson M.D.   On: 07/19/2019 22:33    Procedures Procedures (including critical care time)  Medications Ordered in ED Medications  sodium chloride flush (NS) 0.9 % injection 3 mL (3 mLs Intravenous Given 07/19/19 2048)  meclizine (ANTIVERT) tablet 25 mg (25 mg Oral Given 07/19/19 2122)    ED Course  I have reviewed the triage vital signs and the nursing notes.  Pertinent labs & imaging results that were available during my care of the patient were reviewed by me and considered in my medical decision making (see chart for details).  74 year old male with past medical history significant for previous CVA in childhood, status post CABG, hyperlipidemia, presenting with concern for "room spinning" with dizziness and lightheadedness and unsteady gait earlier this  morning.  Will check CBC, CMP, PT/INR, head CT.   Patient received meclizine 25 mg in the emergency department. CT head negative for acute bleed shows previous left MCA territory infarct.  CBC, PT, PTT, INR within normal limits.  MRI brain shows no acute intracranial abnormality with  encephalomalacia of the left frontal operculum with cystic dilation of the left lateral ventricle.  Patient was discharged with return precautions and recommendation follow-up primary care physician in the next 1 week.   MDM Rules/Calculators/A&P                          74 year old gentleman with past medical history significant for previous CVA in childhood, status post CABG presenting with dizziness and lightheadedness as well as unsteady gait earlier today.  CT head with no acute bleed.  MRI brain significant for no acute intercranial abnormality, encephalomalacia of the left frontal operculum with cystic dilation of the left lateral ventricle.  Most likely etiology vertigo with negative MRI and patient's initial symptoms of feeling in the "room is spinning" are consistent with benign paroxysmal positional vertigo.  Patient discharged home with return precautions and plan to follow-up with a primary care physician next 1 week.   Final Clinical Impression(s) / ED Diagnoses Final diagnoses:  Peripheral vertigo, unspecified laterality    Rx / DC Orders ED Discharge Orders    None       Jackelyn PolingWelborn, Kelvin Burpee, DO 07/19/19 2252    Charlynne PanderYao, David Hsienta, MD 07/20/19 [phone]

## 2019-07-19 NOTE — Discharge Instructions (Addendum)
You came to the emergency department due to concern for possible stroke with some dizziness and difficulty walking this morning.  In the emergency department you had a CT and an MRI of your brain which showed no acute stroke.  I recommend you follow-up with your primary care doctor in the next 1 week.   As we discussed if you have any concerning symptoms such as weakness or numbness in one limb which is outside of your norm, difficulty walking, or confusion we recommend you return to be further evaluated.

## 2019-09-20 ENCOUNTER — Other Ambulatory Visit: Payer: Self-pay | Admitting: Cardiology

## 2019-10-27 ENCOUNTER — Other Ambulatory Visit: Payer: Self-pay | Admitting: Cardiology

## 2020-06-23 ENCOUNTER — Other Ambulatory Visit: Payer: Self-pay | Admitting: Cardiology

## 2020-11-14 ENCOUNTER — Other Ambulatory Visit: Payer: Self-pay | Admitting: Cardiology

## 2021-04-27 ENCOUNTER — Other Ambulatory Visit: Payer: Self-pay | Admitting: Cardiology

## 2021-06-01 ENCOUNTER — Other Ambulatory Visit: Payer: Self-pay | Admitting: Cardiology

## 2021-06-15 ENCOUNTER — Telehealth: Payer: Self-pay | Admitting: Cardiology

## 2021-06-15 NOTE — Telephone Encounter (Signed)
Pt requesting a new code for his MyChart. States he was using an old email, but now has the updated one in system.

## 2021-06-15 NOTE — Telephone Encounter (Signed)
Called pt, we reviewed the e-mail address on file. It is correct. We reviewed his log in information. Pt will attempt to log in, I will call back to verify he was able to log in.

## 2021-06-15 NOTE — Telephone Encounter (Signed)
Called pt back. He was able to log in. He still has a few steps to verify his new e-mail address. He state he will work on it and call us back around 4 tomorrow.

## 2021-06-19 ENCOUNTER — Ambulatory Visit (INDEPENDENT_AMBULATORY_CARE_PROVIDER_SITE_OTHER): Payer: BC Managed Care – PPO | Admitting: Cardiology

## 2021-06-19 ENCOUNTER — Encounter: Payer: Self-pay | Admitting: Cardiology

## 2021-06-19 VITALS — BP 130/68 | HR 73 | Ht 64.0 in | Wt 150.0 lb

## 2021-06-19 DIAGNOSIS — E785 Hyperlipidemia, unspecified: Secondary | ICD-10-CM | POA: Diagnosis not present

## 2021-06-19 DIAGNOSIS — I10 Essential (primary) hypertension: Secondary | ICD-10-CM

## 2021-06-19 DIAGNOSIS — Z951 Presence of aortocoronary bypass graft: Secondary | ICD-10-CM

## 2021-06-19 DIAGNOSIS — I2109 ST elevation (STEMI) myocardial infarction involving other coronary artery of anterior wall: Secondary | ICD-10-CM | POA: Diagnosis not present

## 2021-06-19 DIAGNOSIS — I2510 Atherosclerotic heart disease of native coronary artery without angina pectoris: Secondary | ICD-10-CM

## 2021-06-19 DIAGNOSIS — M79606 Pain in leg, unspecified: Secondary | ICD-10-CM

## 2021-06-19 NOTE — Patient Instructions (Addendum)
Medication Instructions:  Your physician recommends that you continue on your current medications as directed. Please refer to the Current Medication list given to you today.  *If you need a refill on your cardiac medications before your next appointment, please call your pharmacy*  Lab Work: NONE ordered at this time of appointment   If you have labs (blood work) drawn today and your tests are completely normal, you will receive your results only by: MyChart Message (if you have MyChart) OR A paper copy in the mail If you have any lab test that is abnormal or we need to change your treatment, we will call you to review the results.  Testing/Procedures: Your physician has requested that you have an ankle brachial index (ABI). During this test an ultrasound and blood pressure cuff are used to evaluate the arteries that supply the arms and legs with blood. Allow thirty minutes for this exam. There are no restrictions or special instructions.  Follow-Up: At [facility], you and your health needs are our priority.  As part of our continuing mission to provide you with exceptional heart care, we have created designated Provider Care Teams.  These Care Teams include your primary Cardiologist (physician) and Advanced Practice Providers (APPs -  Physician Assistants and Nurse Practitioners) who all work together to provide you with the care you need, when you need it.    Your next appointment:   1 year(s)  The format for your next appointment:   In Person  Provider:   Bryan Lemma, MD     Other Instructions   Important Information About Sugar

## 2021-06-19 NOTE — Progress Notes (Signed)
Primary Care Provider: Georgianne Fick, MD Cardiologist: Bryan Lemma, MD Electrophysiologist: None  Clinic Note: Chief Complaint  Patient presents with   Follow-up    Delayed annual   Coronary Artery Disease    NO angina    ===================================  ASSESSMENT/PLAN   Problem List Items Addressed This Visit       Cardiology Problems   CAD in native artery; occluded LAD, subtotal occlusion of ostial RI, proximal Cx, mid RCA - Primary (Chronic)    He had an anterior STEMI back in 1997 with unsuccessful LAD PCI leading to urgent CABG.  Notable anterior infarct noted on Myoview and echo. Follow-up cardiac catheterization noted flush occlusion of the LAD with severe disease of the RCA, RI and LCx.  All grafts are patent. Most recent Myoview in 2015 was negative for ischemia with prior anterior infarct noted. GXT in February 2020 with no significant EKG abnormalities.  No chest pain after 6 minutes of exertion. No recurrent anginal type chest pain.  Really on minimal medications including atorvastatin and ACE inhibitor. He has not been on a beta-blocker for many years because of history of fatigue and bradycardia and minimal blood pressures. Aspirin was stopped because of bruising.  Have asked that he at least take it every other day or so  Probably would not be due for a baseline skin evaluation until 2025-ish.      Relevant Medications   atorvastatin (LIPITOR) 40 MG tablet   Other Relevant Orders   EKG 12-Lead   Essential hypertension (Chronic)    Pretty well-controlled on low-dose lisinopril. Appropriate on ACE inhibitor with diabetes. 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 Failed LAD PCI, treated with CABG (Chronic)    Distant history of a large anterior STEMI with failed LAD PCI  leading to CABG.  Both Myoview and echo did show evidence ability distribution wall motion abnormalities/Infarct.  No further angina or CHF symptoms.  Is on relatively stable regimen with statin, aspirin, lisinopril. Not on beta-blocker because of prior bradycardia and borderline pressures.      Relevant Medications   atorvastatin (LIPITOR) 40 MG tablet   Hyperlipidemia LDL goal <70 (Chronic)    Unfortunately, I do not have his labs for the last 2 years.  T he says that he is now taking 2 tablets on Tuesdays and Thursdays of the 40 mg atorvastatin.  This was suggested maybe his LDL went up a little bit.  Unfortunate the results.  Management per PCP.       Relevant Medications   atorvastatin (LIPITOR) 40 MG tablet     Other   Leg pain    Hard to tell if his leg pain is really exertional, or musculoskeletal.  He is concerned about the posterior PAD.  We will check ABIs and possible LEA Dopplers..      Relevant Orders   VAS [place] LOWER EXTREMITY ARTERIAL DUPLEX   VAS [place] LE ART SEG MULTI (Segm&LE Reynauds)   S/P CABG x 5 (Chronic)    Last stress test was a GXTIn 2020.  No symptoms after walking 6 minutes.  So relatively normal as far as any signs of ischemia clinically.  Depending on symptoms we may or may not consider reevaluation before 2024-2025, but suspected we can probably wait till least  2024 before we do another follow-up Myoview.      Relevant Orders   EKG 12-Lead    ===================================  HPI:    JOSEY FORCIER is a 76 y.o. male with a PMH below who presents today for ~ 2 year. at the request of Georgianne Fick, MD.   CAD History 1997 Large Anterior STEMI: BMS PCI ostial LAD, also noted 95% focal RCA stenosis. ->  Referred for CABG x5 1997 CABG x5: LIMA-LAD, RIMA-RCA, SVG-RI a-D1, SVG-OM 2002 -Cath: Flush occlusion of LAD, RI and LCx prox 99% and mid RCA 95%; Patent Grafts Moview 2010 -no ischemia or infarction Myoview 2015: Limited mid  anterior wall scarring with sparing of the apex consistent with known flush occlusion of proximal LAD and prior LAD infarct. Echo: EF 45-50%, mild Apical & Septal HK, Gr 1 DD.   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  Was very active doing lots of walking between house sites.  Working complement and enjoying the additional income.  No cardiac symptoms.  Recent Hospitalizations: None  Reviewed  CV studies:    The following studies were reviewed today: (if available, images/films reviewed: From Epic Chart or Care Everywhere) No new studies :  Interval History:   AUL MANGIERI returns here for almost 9 to posterior follow-up doing pretty well.  He really does not have any major complaints.  He retired this past year.  He is now enjoying relaxing.  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  Partly because this is become a little less active than he had been.  Not doing the walking they have been doing between job sites.  He endorses a sometimes after sitting for a while and it goes to stand up he may get little bit dizzy but he also says that sometimes he feels discomfort in his legs when he walks longer distances.  82 has been attributing this to the fact that he has not really been as active as he had not been related as it walking more and more Because of discomfort in his legs.  No edema.  No PND orthopnea.  He has not had any anginal chest pain/pressure or dyspnea with rest or exertion.  He is just a little bit deconditioned since retiring.  No rapid irregular heartbeats or palpitations.  No syncope or near syncope.  No TIA or amaurosis fugax.  Currently dealing with allergy related sinus congestion.  Not really URI symptoms.  This little bit of congestion and associated postnasal drip. Second  grandchild due soon.  CV Review of Symptoms (Summary) Cardiovascular ROS: positive for - dyspnea on exertion and -this is really associated with vigorous exertion.  He also notes some leg aching when he overdoes it; notes some mild exercise intolerance due to deconditioning.  Dizziness sometimes with getting up and going to fast.. negative for - chest pain, edema, loss of consciousness, orthopnea, paroxysmal nocturnal dyspnea, shortness of breath, or syncope/near syncope, TIA/amaurosis fugax.  REVIEWED OF SYSTEMS   Review of Systems - Negative except mild exercise intolerance General ROS: positive for  - less exercise - no longer working negative for - fatigue, weight gain, or weight loss Respiratory ROS: positive for - nasal congestion - post-nasal drip negative for - cough, shortness of breath, or wheezing Musculoskeletal ROS:  positive for - joint pain and legs ache with long walks  I have reviewed and (if needed) personally updated the patient's problem list, medications, allergies, past medical and surgical history, social and family history.   PAST MEDICAL HISTORY   Past Medical History:  Diagnosis Date   Acute MI, anterior wall, subsequent episode of care (HCC) 1997   Failed PCI-LAD --> referred for CABG x 5 (flush occluded LAD, along with severe disease in RCA, RI and LCx)   CAD in native artery 2002   Cardiac cath: Ostial LAD 100%; R. I 99% proximal, Prox Cx  99%; 90-95% mid RCA.  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 Patent LIMA-LAD, RIMA-RCA,  SVG to OM, SVG-RI-D1   CARDIAC CATHETERIZATION  11/27/1995   Totally occluded LAD stented with a 3x15 Multi-Link stent, deployed at 8atm for 52sec. 95% focal eccentric stenosis of the RCA. Considered CABG versus angioplasty of the RCA lesion.   CORONARY ARTERY BYPASS GRAFT   1997   LIMA-LAD, RIMA-RCA, SVG-RI a-D1, SVG-OM   NM MYOVIEW LTD  2010   LOW RISK. No ischemia or infarct.   NM MYOVIEW LTD  03/29/2013   LOW RISK: No ischemia. Limited mid anterior wall scar sparing septum and apex; suggests occluded LAD with patent distal bypass versus diagonal disease --> consistent with known CAD   TRANSTHORACIC ECHOCARDIOGRAM   August 2012   Mildly dilated LV; EF 45-50%. Mild apical HK and mild septal HK with impaired relaxation (grade 1 diastolic function) mildly reduced RV function.    There is no immunization history on file for this patient.  MEDICATIONS/ALLERGIES   Current Meds  Medication Sig   atorvastatin (LIPITOR) 40 MG tablet TAKE 1 TABLET AT 6PM.   atorvastatin (LIPITOR) 40 MG tablet 2 tablets on Tuesday/Thursday, 1 tablet on all other days   Dapagliflozin-Metformin HCl ER (XIGDUO XR) 05-998 MG TB24 Take 2 tablets by mouth daily with supper.    dorzolamide-timolol (COSOPT) 22.3-6.8 MG/ML ophthalmic solution Place 1 drop into both eyes in the morning and at bedtime.   insulin aspart (NOVOLOG) 100 UNIT/ML injection Inject 10 Units into the skin daily before supper.    Insulin Glargine (BASAGLAR KWIKPEN) 100 UNIT/ML SOPN Inject 43 Units into the skin daily after breakfast.    lisinopril (ZESTRIL) 2.5 MG tablet Take 1 tablet (2.5 mg total) by mouth daily. Please schedule appt for future refills. 2nd attempt   Multiple Vitamins-Minerals (PRESERVISION AREDS 2) CAPS 2 capsules   NITROGLYCERIN PO Place 1 tablet under the tongue every 5 (five) minutes as needed (for chest pain).     No Known Allergies  SOCIAL HISTORY/FAMILY HISTORY   Reviewed in Epic:  Pertinent findings:  Social History    Tobacco Use   Smoking status: Never   Smokeless tobacco: Never  Substance Use Topics   Alcohol use: No    Alcohol/week: 0.0 standard drinks of alcohol   Drug use: No   Social History   Social  History Narrative   He is a married father of 2. He walks daily and gets plan exercise. Never smoked and does not drink alcohol.    OBJCTIVE -PE, EKG, labs   Wt Readings from Last 3 Encounters:  06/19/21 150 lb (68 kg)  07/19/19 152 lb (68.9 kg)  04/06/19 149 lb 9.6 oz (67.9 kg)    Physical Exam: BP 130/68   Pulse 73   Ht 5\' 4"  (1.626 m)   Wt 150 lb (68 kg)   SpO2 98%   BMI 25.75 kg/m  Physical Exam Vitals reviewed.  Constitutional:      General: He is not in acute distress.    Appearance: Normal appearance. He is normal weight. He is not ill-appearing or toxic-appearing.     Comments: Healthy.  Well-nourished and well-groomed.  HENT:     Head: Normocephalic and atraumatic.  Neck:     Vascular: No carotid bruit.  Cardiovascular:     Rate and Rhythm: Normal rate and regular rhythm.     Pulses: Normal pulses.     Heart sounds: Normal heart sounds. No murmur heard.    No friction rub. No gallop.  Pulmonary:     Effort: Pulmonary effort is normal. No respiratory distress.     Breath sounds: Normal breath sounds. No wheezing, rhonchi or rales.  Chest:     Chest wall: No tenderness.  Musculoskeletal:        General: Deformity (Right arm-disabled from neonatal stroke) present. No swelling.     Cervical back: Normal range of motion and neck supple.  Skin:    General: Skin is dry.  Neurological:     General: No focal deficit present.     Mental Status: He is alert and oriented to person, place, and time. Mental status is at baseline.     Gait: Gait normal.  Psychiatric:        Mood and Affect: Mood normal.        Behavior: Behavior normal.        Thought Content: Thought content normal.        Judgment: Judgment normal.     Adult ECG Report  Rate: 73;  Rhythm: normal  sinus rhythm, premature atrial contractions (PAC), and otherwise normal axis, normal durations. ;   Narrative Interpretation: Stable  Recent Labs:   Lipids were checked in June 2023 but not available. No results found for: "CHOL", "HDL", "LDLCALC", "LDLDIRECT", "TRIG", "CHOLHDL" Lab Results  Component Value Date   CREATININE 0.80 07/19/2019   BUN 22 07/19/2019   NA 141 07/19/2019   K 4.3 07/19/2019   CL 104 07/19/2019   CO2 22 07/19/2019      Latest Ref Rng & Units 07/19/2019    4:21 PM 07/19/2019    3:31 PM 06/14/2012   10:32 PM  CBC  WBC 4.0 - 10.5 K/uL  7.3    Hemoglobin 13.0 - 17.0 g/dL 08/14/2012  16.1  09.6   Hematocrit 39.0 - 52.0 % 51.0  50.1  37.0   Platelets 150 - 400 K/uL  195      No results found for: "HGBA1C" No results found for: "TSH"  ==================================================  COVID-19 Education: The signs and symptoms of COVID-19 were discussed with the patient and how to seek care for testing (follow up with PCP or arrange E-visit).    I spent a total of 19 minutes with the patient spent in direct patient consultation.  Additional time spent with chart review  / charting (  studies, outside notes, etc): 12 min Total Time: 31 min  Current medicines are reviewed at length with the patient today.  (+/- concerns) n/a  This visit occurred during the SARS-CoV-2 public health emergency.  Safety protocols were in place, including screening questions prior to the visit, additional usage of staff PPE, and extensive cleaning of exam room while observing appropriate contact time as indicated for disinfecting solutions.  Notice: This dictation was prepared with Dragon dictation along with smart phrase technology. Any transcriptional errors that result from this process are unintentional and may not be corrected upon review.  Studies Ordered:   Orders Placed This Encounter  Procedures   EKG 12-Lead   VAS [place] LOWER EXTREMITY ARTERIAL DUPLEX   VAS US LE ART SEG MULTI  (Segm&LE Reynauds)   No orders of the defined types were placed in this encounter.   Patient Instructions / Medication Changes & Studies & Tests Ordered   Patient Instructions  Medication Instructions:  Your physician recommends that you continue on your current medications as directed. Please refer to the Current Medication list given to you today.  *If you need a refill on your cardiac medications before your next appointment, please call your pharmacy*  Lab Work: NONE ordered at this time of appointment   If you have labs (blood work) drawn today and your tests are completely normal, you will receive your results only by: MyChart Message (if you have MyChart) OR A paper copy in the mail If you have any lab test that is abnormal or we need to change your treatment, we will call you to review the results.  Testing/Procedures: Your physician has requested that you have an ankle brachial index (ABI). During this test an ultrasound and blood pressure cuff are used to evaluate the arteries that supply the arms and legs with blood. Allow thirty minutes for this exam. There are no restrictions or special instructions.  Follow-Up: At [facility], you and your health needs are our priority.  As part of our continuing mission to provide you with exceptional heart care, we have created designated Provider Care Teams.  These Care Teams include your primary Cardiologist (physician) and Advanced Practice Providers (APPs -  Physician Assistants and Nurse Practitioners) who all work together to provide you with the care you need, when you need it.    Your next appointment:   1 year(s)  The format for your next appointment:   In Person  Provider:   Bryan Lemmaavid Fitzroy Mikami, MD     Other Instructions   Important Information About Sugar        Bryan Lemmaavid Camile Esters, M.D., M.S. Interventional Cardiologist   Pager # [phone] Phone # [phone] [address] [place], [place]  [id number]   Thank you for choosing [facility] at [facility]!!

## 2021-07-18 ENCOUNTER — Encounter: Payer: Self-pay | Admitting: Cardiology

## 2021-07-18 NOTE — Assessment & Plan Note (Signed)
Unfortunately, I do not have his labs for the last 2 years.  T he says that he is now taking 2 tablets on Tuesdays and Thursdays of the 40 mg atorvastatin.  This was suggested maybe his LDL went up a little bit.  Unfortunate the results.  Management per PCP.

## 2021-07-18 NOTE — Assessment & Plan Note (Signed)
Distant history of a large anterior STEMI with failed LAD PCI leading to CABG.  Both Myoview and echo did show evidence ability distribution wall motion abnormalities/Infarct.  No further angina or CHF symptoms.  Is on relatively stable regimen with statin, aspirin, lisinopril. Not on beta-blocker because of prior bradycardia and borderline pressures.

## 2021-07-18 NOTE — Assessment & Plan Note (Signed)
He had an anterior STEMI back in 1997 with unsuccessful LAD PCI leading to urgent CABG.  Notable anterior infarct noted on Myoview and echo. Follow-up cardiac catheterization noted flush occlusion of the LAD with severe disease of the RCA, RI and LCx.  All grafts are patent. Most recent Myoview in 2015 was negative for ischemia with prior anterior infarct noted. GXT in February 2020 with no significant EKG abnormalities.  No chest pain after 6 minutes of exertion. No recurrent anginal type chest pain.   Really on minimal medications including atorvastatin and ACE inhibitor.  He has not been on a beta-blocker for many years because of history of fatigue and bradycardia and minimal blood pressures.  Aspirin was stopped because of bruising.  Have asked that he at least take it every other day or so  Probably would not be due for a baseline skin evaluation until 2025-ish.

## 2021-07-18 NOTE — Assessment & Plan Note (Addendum)
Hard to tell if his leg pain is really exertional, or musculoskeletal.  He is concerned about the posterior PAD.  We will check ABIs and possible LEA Dopplers.Levi Obrien

## 2021-07-18 NOTE — Assessment & Plan Note (Signed)
Last stress test was a GXTIn 2020.  No symptoms after walking 6 minutes.  So relatively normal as far as any signs of ischemia clinically.  Depending on symptoms we may or may not consider reevaluation before 2024-2025, but suspected we can probably wait till least 2024 before we do another follow-up Myoview.

## 2021-07-18 NOTE — Assessment & Plan Note (Addendum)
Pretty well-controlled on low-dose lisinopril. Appropriate on ACE inhibitor with diabetes. Not on beta-blocker because of fatigue and bradycardia in the past

## 2021-07-22 ENCOUNTER — Ambulatory Visit (HOSPITAL_COMMUNITY)
Admission: RE | Admit: 2021-07-22 | Discharge: 2021-07-22 | Disposition: A | Discharge status: Home / Self Care | Payer: BC Managed Care – PPO | Source: Ambulatory Visit | Attending: Cardiovascular Disease | Admitting: Cardiovascular Disease

## 2021-07-22 DIAGNOSIS — M79606 Pain in leg, unspecified: Secondary | ICD-10-CM | POA: Diagnosis present

## 2021-07-22 DIAGNOSIS — M79604 Pain in right leg: Secondary | ICD-10-CM

## 2021-07-22 DIAGNOSIS — M79605 Pain in left leg: Secondary | ICD-10-CM

## 2022-01-31 ENCOUNTER — Telehealth: Payer: Self-pay | Admitting: Pharmacist

## 2022-01-31 NOTE — Telephone Encounter (Signed)
Medication Management, with Dr. Adrian Prows as PI, is conducting a 32 month phase 3 trial evaluating the effectiveness & safety of Obicetrapib, a CETP inhibitor, on MACE outcomes. Patients, in addition to having ASCVD,  must be on maximally tolerated lipid modifying therapy to be eligible. Levi Obrien may be eligible for the trial & has been referred by Dr. Ashby Dawes to screen. In discussion with the patient, he is interested but we would like for you to be aware & to make sure that your are OK with his participation should he be eligible. Please let me know if you are OK with this.

## 2024-02-06 ENCOUNTER — Encounter (HOSPITAL_COMMUNITY): Payer: Self-pay

## 2024-02-06 ENCOUNTER — Emergency Department (HOSPITAL_COMMUNITY)
Admission: EM | Admit: 2024-02-06 | Discharge: 2024-02-07 | Disposition: A | Attending: Emergency Medicine | Admitting: Emergency Medicine

## 2024-02-06 ENCOUNTER — Other Ambulatory Visit: Payer: Self-pay

## 2024-02-06 ENCOUNTER — Emergency Department (HOSPITAL_COMMUNITY)

## 2024-02-06 DIAGNOSIS — Z7984 Long term (current) use of oral hypoglycemic drugs: Secondary | ICD-10-CM | POA: Diagnosis not present

## 2024-02-06 DIAGNOSIS — W19XXXA Unspecified fall, initial encounter: Secondary | ICD-10-CM

## 2024-02-06 DIAGNOSIS — E119 Type 2 diabetes mellitus without complications: Secondary | ICD-10-CM | POA: Insufficient documentation

## 2024-02-06 DIAGNOSIS — S0990XA Unspecified injury of head, initial encounter: Secondary | ICD-10-CM | POA: Diagnosis not present

## 2024-02-06 DIAGNOSIS — I2510 Atherosclerotic heart disease of native coronary artery without angina pectoris: Secondary | ICD-10-CM | POA: Insufficient documentation

## 2024-02-06 DIAGNOSIS — R0789 Other chest pain: Secondary | ICD-10-CM | POA: Insufficient documentation

## 2024-02-06 DIAGNOSIS — Z794 Long term (current) use of insulin: Secondary | ICD-10-CM | POA: Insufficient documentation

## 2024-02-06 DIAGNOSIS — W1839XA Other fall on same level, initial encounter: Secondary | ICD-10-CM | POA: Diagnosis not present

## 2024-02-06 DIAGNOSIS — M79644 Pain in right finger(s): Secondary | ICD-10-CM | POA: Diagnosis present

## 2024-02-06 DIAGNOSIS — S62640A Nondisplaced fracture of proximal phalanx of right index finger, initial encounter for closed fracture: Secondary | ICD-10-CM | POA: Diagnosis not present

## 2024-02-06 NOTE — ED Triage Notes (Addendum)
 [place] [employer] today hitting his head (left forehead), pt has swelling and bruising. Pt denies loc or taking blood thinners. Pt has an index finger injury to his right hand pt states that he feels like it is broken. Pt also injured his knees, ambulatory to triage.

## 2024-02-06 NOTE — ED Provider Notes (Signed)
 " [place] EMERGENCY DEPARTMENT AT [facility] Provider Note   CSN: [id number] Arrival date & time: 02/06/24  2202     Patient presents with: Levi Obrien is a 79 y.o. male.  {Add pertinent medical, surgical, social history, OB history to YEP:67052} The history is provided by the patient and medical records.  Fall  Levi Obrien is a 78 y.o. male who presents to the Emergency Department complaining of fall.  He presents to the emergency department accompanied by his wife for evaluation of injuries having a fall that occurred around 8 PM.  He states that he was walking into the sheets in [place] when he stubbed his foot on a metal ledge and fell, hitting his head on an advertising stand.  He also hit his right index finger and his knees.  He complains of pain to his finger, pain to his head and pain to his right knee.  When coming to the emergency department he developed some chest discomfort.  He has a history of diabetes, coronary artery disease, congenital right upper extremity weakness.  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     Prior to Admission medications  Medication Sig Start Date End Date Taking? Authorizing Provider  atorvastatin  (LIPITOR) 40 MG tablet TAKE 1 TABLET AT 6PM. 11/14/20   Anner Alm ORN, MD  atorvastatin  (LIPITOR) 40 MG tablet 2 tablets on Tuesday/Thursday, 1 tablet on all other days 06/16/21   [provider]  Dapagliflozin-Metformin HCl ER (XIGDUO XR) 05-998 MG TB24 Take 2 tablets by mouth daily with supper.     [provider]  dorzolamide-timolol (COSOPT) 22.3-6.8 MG/ML ophthalmic solution Place 1 drop into both eyes in the morning and at bedtime. 04/30/19   [provider]  insulin aspart (NOVOLOG) 100 UNIT/ML injection Inject 10 Units into the skin daily before supper.     [provider]  Insulin Glargine (BASAGLAR  KWIKPEN) 100 UNIT/ML SOPN Inject 43 Units into the skin daily after breakfast.     [provider]  lisinopril  (ZESTRIL ) 2.5 MG tablet Take 1 tablet (2.5 mg total) by mouth daily. Please schedule appt for future refills. 2nd attempt 06/01/21   Anner Alm ORN, MD  Multiple Vitamins-Minerals (PRESERVISION AREDS 2) CAPS 2 capsules    [provider]  NITROGLYCERIN PO Place 1 tablet under the tongue every 5 (five) minutes as needed (for chest pain).     [provider]    Allergies: Patient has no known allergies.    Review of Systems  All other systems reviewed and are negative.   Updated Vital Signs BP (!) 172/81   Pulse (!) 52   Temp 98.5 F (36.9 C) (Oral)   Resp 20   SpO2 96%   Physical Exam Vitals and nursing note reviewed.  Constitutional:      Appearance: He is well-developed.  HENT:     Head: Normocephalic.     Comments: Soft tissue swelling and ecchymosis over the left temple Cardiovascular:     Rate and Rhythm: Normal rate and regular rhythm.     Heart sounds: No murmur heard. Pulmonary:     Effort: Pulmonary effort is normal. No respiratory distress.     Breath sounds: Normal breath sounds.  Abdominal:     Palpations: Abdomen is soft.     Tenderness: There is no abdominal tenderness. There is no guarding  or rebound.  Musculoskeletal:        General: No tenderness.     Cervical back: Neck supple. No tenderness.     Comments: There is ulnar deviation of the right 2nd through 5th digits.  There is an abrasion at the base of the second digit.  He is unable to flex the second digit and has tenderness in this area.  He has mild soft tissue tenderness over the 3rd through 5th MCP joints.  There is mild soft tissue tenderness over the right knee but he is able to flex and extend at the knee.  No tenderness over the hips.  Skin:    General: Skin is warm and dry.  Neurological:     Mental Status: He is alert and oriented to person, place, and time.   Psychiatric:        Behavior: Behavior normal.     (all labs ordered are listed, but only abnormal results are displayed) Labs Reviewed  BASIC METABOLIC PANEL WITH GFR  CBC WITH DIFFERENTIAL/PLATELET  TROPONIN T, HIGH SENSITIVITY    EKG: None  Radiology: DG Finger Index Right Result Date: 02/06/2024 EXAM: 3 VIEW(S) XRAY OF THE FINGER(S) 02/06/2024 10:25:24 PM COMPARISON: None available. CLINICAL HISTORY: Patient fell. Recent fall with second digit pain, initial encounter. FINDINGS: BONES AND JOINTS: Nondisplaced fracture at the base of the second proximal phalanx. SOFT TISSUES: Soft tissue edema. IMPRESSION: 1. Nondisplaced fracture at the base of the second proximal phalanx. 2. Soft tissue edema. Electronically signed by: Oneil Devonshire MD 02/06/2024 10:28 PM EST RP Workstation: [id number]    {Document cardiac monitor, telemetry assessment procedure when appropriate:32947} Procedures   Medications Ordered in the ED - No data to display    {Click here for ABCD2, HEART and other calculators REFRESH Note before signing:1}                              Medical Decision Making Amount and/or Complexity of Data Reviewed Labs: ordered. Radiology: ordered.   ***  {Document critical care time when appropriate  Document review of labs and clinical decision tools ie CHADS2VASC2, etc  Document your independent review of radiology images and any outside records  Document your discussion with family members, caretakers and with consultants  Document social determinants of health affecting pt's care  Document your decision making why or why not admission, treatments were needed:32947:::1}   Final diagnoses:  None    ED Discharge Orders     None        "

## 2024-02-07 LAB — BASIC METABOLIC PANEL WITH GFR
Anion gap: 12 (ref 5–15)
BUN: 27 mg/dL — ABNORMAL HIGH (ref 8–23)
CO2: 23 mmol/L (ref 22–32)
Calcium: 9.6 mg/dL (ref 8.9–10.3)
Chloride: 109 mmol/L (ref 98–111)
Creatinine, Ser: 0.76 mg/dL (ref 0.61–1.24)
GFR, Estimated: >60 mL/min
Glucose, Bld: 163 mg/dL — ABNORMAL HIGH (ref 70–99)
Potassium: 4 mmol/L (ref 3.5–5.1)
Sodium: 144 mmol/L (ref 135–145)

## 2024-02-07 LAB — CBC WITH DIFFERENTIAL/PLATELET
Abs Immature Granulocytes: 0.03 10*3/uL (ref 0.00–0.07)
Basophils Absolute: 0.1 10*3/uL (ref 0.0–0.1)
Basophils Relative: 1 %
Eosinophils Absolute: 0.1 10*3/uL (ref 0.0–0.5)
Eosinophils Relative: 1 %
HCT: 51.4 % (ref 39.0–52.0)
Hemoglobin: 16.5 g/dL (ref 13.0–17.0)
Immature Granulocytes: 0 %
Lymphocytes Relative: 21 %
Lymphs Abs: 1.9 10*3/uL (ref 0.7–4.0)
MCH: 30.3 pg (ref 26.0–34.0)
MCHC: 32.1 g/dL (ref 30.0–36.0)
MCV: 94.3 fL (ref 80.0–100.0)
Monocytes Absolute: 0.9 10*3/uL (ref 0.1–1.0)
Monocytes Relative: 11 %
Neutro Abs: 5.9 10*3/uL (ref 1.7–7.7)
Neutrophils Relative %: 66 %
Platelets: 244 10*3/uL (ref 150–400)
RBC: 5.45 MIL/uL (ref 4.22–5.81)
RDW: 14.2 % (ref 11.5–15.5)
WBC: 8.9 10*3/uL (ref 4.0–10.5)
nRBC: 0 % (ref 0.0–0.2)

## 2024-02-07 LAB — TROPONIN T, HIGH SENSITIVITY
Troponin T High Sensitivity: 13 ng/L (ref 0–19)
Troponin T High Sensitivity: 14 ng/L (ref 0–19)
